# Patient Record
Sex: Female | Born: 1995 | Race: Black or African American | Hispanic: No | Marital: Single | Smoking: Never smoker
Health system: Southern US, Community
[De-identification: ages and names within clinical notes are randomized; demographics above are authoritative.]

## PROBLEM LIST (undated history)

## (undated) ENCOUNTER — Inpatient Hospital Stay (HOSPITAL_COMMUNITY): Payer: Self-pay

## (undated) DIAGNOSIS — F419 Anxiety disorder, unspecified: Secondary | ICD-10-CM

## (undated) DIAGNOSIS — F32A Depression, unspecified: Secondary | ICD-10-CM

## (undated) DIAGNOSIS — F909 Attention-deficit hyperactivity disorder, unspecified type: Secondary | ICD-10-CM

## (undated) DIAGNOSIS — F329 Major depressive disorder, single episode, unspecified: Secondary | ICD-10-CM

---

## 2016-01-18 ENCOUNTER — Emergency Department (HOSPITAL_COMMUNITY)
Admission: EM | Admit: 2016-01-18 | Discharge: 2016-01-18 | Disposition: A | Discharge status: Home / Self Care | Payer: BLUE CROSS/BLUE SHIELD | Attending: Physician Assistant | Admitting: Physician Assistant

## 2016-01-18 ENCOUNTER — Encounter (HOSPITAL_COMMUNITY): Payer: Self-pay

## 2016-01-18 DIAGNOSIS — N6001 Solitary cyst of right breast: Secondary | ICD-10-CM | POA: Diagnosis present

## 2016-01-18 DIAGNOSIS — L989 Disorder of the skin and subcutaneous tissue, unspecified: Secondary | ICD-10-CM | POA: Insufficient documentation

## 2016-01-18 DIAGNOSIS — L988 Other specified disorders of the skin and subcutaneous tissue: Secondary | ICD-10-CM

## 2016-01-18 DIAGNOSIS — N649 Disorder of breast, unspecified: Secondary | ICD-10-CM

## 2016-01-18 MED ORDER — DAPSONE 5 % EX GEL: CUTANEOUS | Status: DC

## 2016-01-18 NOTE — Discharge Instructions (Signed)
Fibrocystic Breast Changes Fibrocystic breast changes happen when tiny sacs filled with fluid form in the breast. They are not cancer. They can feel like lumps. HOME CARE  Check your breasts after every menstrual period or the first day of every month if you do not have menstrual periods. Check for:  Soreness.  New puffiness (swelling).  A change in breast size.  A change in a lump that was already there.  Only take medicine as told by your doctor.  Wear a support or sports bra that fits well.  Avoid caffeine in pop, chocolate, coffee, and tea. GET HELP IF:   You have fluid coming from your nipples, especially if it is bloody.  You have new lumps or bumps in your breast.  Your breast becomes puffy, red, and painful.  You have changes in how your breast looks.  Your nipples look flat or sunk in.   This information is not intended to replace advice given to you by your health care provider. Make sure you discuss any questions you have with your health care provider.   Document Released: 09/23/2008 Document Revised: 10/16/2013 Document Reviewed: 04/01/2013 Elsevier Interactive Patient Education 2016 Elsevier Inc[employer]   Abscess An abscess (boil or furuncle) is an infected area on or under the skin. This area is filled with yellowish-white fluid (pus) and other material (debris). HOME CARE   Only take medicines as told by your doctor.  If you were given antibiotic medicine, take it as directed. Finish the medicine even if you start to feel better.  If gauze is used, follow your doctor's directions for changing the gauze.  To avoid spreading the infection:  Keep your abscess covered with a bandage.  Wash your hands well.  Do not share personal care items, towels, or whirlpools with others.  Avoid skin contact with others.  Keep your skin and clothes clean around the abscess.  Keep all doctor visits as told. GET HELP RIGHT AWAY IF:   You have more pain, puffiness  (swelling), or redness in the wound site.  You have more fluid or blood coming from the wound site.  You have muscle aches, chills, or you feel sick.  You have a fever. MAKE SURE YOU:   Understand these instructions.  Will watch your condition.  Will get help right away if you are not doing well or get worse.   This information is not intended to replace advice given to you by your health care provider. Make sure you discuss any questions you have with your health care provider.   Document Released: 03/29/2008 Document Revised: 04/11/2012 Document Reviewed: 12/25/2011 Elsevier Interactive Patient Education [employer] Elsevier Inc.

## 2016-01-18 NOTE — ED Notes (Signed)
Onset 2 days lump on right breast near nipple.  No nipple discharge.

## 2016-01-18 NOTE — ED Provider Notes (Signed)
CSN: [id number]     Arrival date & time 01/18/16  2137 History  By signing my name below, I, Evon Slack, attest that this documentation has been prepared under the direction and in the presence of Danelle Berry, PA-C. Electronically Signed: Evon Slack, ED Scribe. 01/18/2016. 11:31 PM.    Chief Complaint  Patient presents with  . Breast Mass   The history is provided by the patient. No language interpreter was used.   HPI Comments: Cynthia Hoffman is a 20 y.o. female who presents to the Emergency Department complaining of right breast mass onset 3 days prior. Pt reports associated redness, she denies tenderness, drainage, swelling.  She looked down in the shower and noticed discoloration. Pt has not tried any treatment  PTA. Denies fever, nausea, vomiting, swollen lymph nodes, tender breasts, or hx of breast changes with periods. Pt reports Hx of hidradenitis. She states that her PCP prescribes oral dapsone to help prevent infections, however she has run out, and PCP states she needs labs before having meds again.  No family hx of breast cancer. LMP 12/30/14  History reviewed. No pertinent past medical history. History reviewed. No pertinent past surgical history. History reviewed. No pertinent family history. Social History  Substance Use Topics  . Smoking status: Never Smoker   . Smokeless tobacco: None  . Alcohol Use: No   OB History    No data available     Review of Systems  Constitutional: Negative.  Negative for fever.  HENT: Negative.   Respiratory: Negative.   Cardiovascular: Negative.   Gastrointestinal: Negative.  Negative for nausea and vomiting.  Genitourinary: Negative for menstrual problem.  Skin: Positive for color change. Negative for pallor, rash and wound.  Hematological: Negative for adenopathy.  All other systems reviewed and are negative.     Allergies  Review of patient's allergies indicates no known allergies.  Home Medications   Prior to  Admission medications   Medication Sig Start Date End Date Taking? Authorizing Provider  Dapsone 5 % topical gel Apply pea-sized amount in a thin layer to affected areas twice daily 01/18/16   Massimiliano Rohleder, PA-C   BP 142/94 mmHg  Pulse 96  Temp(Src) 98.1 F (36.7 C) (Oral)  Resp 16  Ht  (1.6 m)  Wt 90.402 kg  BMI 35.31 kg/m2  SpO2 98%  LMP 12/28/2015   Physical Exam  Constitutional: She is oriented to person, place, and time. She appears well-developed and well-nourished. No distress.  HENT:  Head: Normocephalic and atraumatic.  Right Ear: External ear normal.  Left Ear: External ear normal.  Nose: Nose normal.  Mouth/Throat: Oropharynx is clear and moist. No oropharyngeal exudate.  Eyes: Conjunctivae and EOM are normal. Pupils are equal, round, and reactive to light. Right eye exhibits no discharge. Left eye exhibits no discharge. No scleral icterus.  Neck: Normal range of motion. Neck supple. No JVD present. No tracheal deviation present.  Cardiovascular: Normal rate and regular rhythm.   Pulmonary/Chest: Effort normal and breath sounds normal. No stridor. No respiratory distress. She exhibits no mass and no tenderness. Right breast exhibits skin change. Right breast exhibits no inverted nipple, no mass, no nipple discharge and no tenderness. Breasts are symmetrical.    Genitourinary: No breast swelling, tenderness, discharge or bleeding.  Musculoskeletal: Normal range of motion. She exhibits no edema.  Lymphadenopathy:    She has no cervical adenopathy.    She has no axillary adenopathy.       Right axillary: No pectoral  and no lateral adenopathy present.  Neurological: She is alert and oriented to person, place, and time. She exhibits normal muscle tone. Coordination normal.  Skin: Skin is warm and dry. No rash noted. She is not diaphoretic. No erythema. No pallor.  Psychiatric: She has a normal mood and affect. Her behavior is normal. Judgment and thought content normal.   Nursing note and vitals reviewed.   ED Course  Procedures (including critical care time) DIAGNOSTIC STUDIES: Oxygen Saturation is 98% on RA, normal by my interpretation.    COORDINATION OF CARE: 11:32 PM-Discussed treatment plan with pt at bedside and pt agreed to plan.     Labs Review Labs Reviewed - No data to display  Imaging Review No results found.    EKG Interpretation None      MDM   Pt with reported hx of hidradenitis presents to the ER with CC of lump on breast that is hyperpigmented, non-tender, without redness, swelling or discharge.  She is a [employer] student, out of dapsone - her regular med to tx hidradenitis, states she wants something to treat skin.  Her PCP cannot refill med until she has lab work.    On exam, there is small hyperpigmented area, with some peeling skin, no swelling, erythema, tenderness, discharge.  Breast exam is normal.  Pt has no concerning lymph nodes.  Appears consistent with healing lesion.  No concern for abscess or cellulitis.  Will give topical dapsone.  Pt was encouraged to establish PCP in area or see OB/GYN for follow up.  Pt in agreement with plan.  She was discharged home in good condition, VSS.  Final diagnoses:  Lesion of skin of breast  Breast cyst, right   I personally performed the services described in this documentation, which was scribed in my presence. The recorded information has been reviewed and is accurate.        Danelle BerryLeisa Keaundre Thelin, PA-C 01/24/16 [id number]  Courteney Randall AnLyn Mackuen, MD 01/24/16 1806

## 2016-01-18 NOTE — ED Notes (Signed)
See EDP assessment 

## 2016-02-07 ENCOUNTER — Encounter (HOSPITAL_COMMUNITY): Payer: Self-pay | Admitting: Emergency Medicine

## 2016-02-07 ENCOUNTER — Emergency Department (HOSPITAL_COMMUNITY)
Admission: EM | Admit: 2016-02-07 | Discharge: 2016-02-07 | Disposition: A | Discharge status: Home / Self Care | Payer: BLUE CROSS/BLUE SHIELD | Attending: Emergency Medicine | Admitting: Emergency Medicine

## 2016-02-07 DIAGNOSIS — N611 Abscess of the breast and nipple: Secondary | ICD-10-CM | POA: Diagnosis not present

## 2016-02-07 DIAGNOSIS — Z79899 Other long term (current) drug therapy: Secondary | ICD-10-CM | POA: Insufficient documentation

## 2016-02-07 MED ORDER — DOXYCYCLINE HYCLATE 100 MG PO CAPS: 100.0000 mg | ORAL_CAPSULE | Freq: Two times a day (BID) | ORAL | Status: DC

## 2016-02-07 MED ORDER — HYDROCODONE-ACETAMINOPHEN 5-325 MG PO TABS: 1.0000 | ORAL_TABLET | Freq: Four times a day (QID) | ORAL | Status: DC | PRN

## 2016-02-07 NOTE — ED Notes (Signed)
Pt has cyst on chest that has been persistent for 3-4 weeks. Pt received topical cream for cyst. Pt states it has not gotten better.

## 2016-02-07 NOTE — ED Notes (Signed)
The pt has had an abscess on her rt breast for approx 3 months.  She was seen here 2 weeks ago for the same and given med but she does not think it is any better  She does not want it drained  She does not want a scar

## 2016-02-07 NOTE — ED Provider Notes (Signed)
CSN: [id number]     Arrival date & time 02/07/16  1505 History   First MD Initiated Contact with Patient 02/07/16 1710     Chief Complaint  Patient presents with  . Abscess     (Consider location/radiation/quality/duration/timing/severity/associated sxs/prior Treatment) HPI Comments: Patient presents to the emergency department with chief complaint of right-sided breast pain 3 months. She states that she has been being treated for breast abscess. She has a history of hidradenitis, but has never had an abscess on her breast. She denies any fevers, chills, nausea, or vomiting. She states that the symptoms have been gradually worsening over the past 2 weeks. Her symptoms are worsened with palpation. She denies any discharge. There are no other associated symptoms or modifying factors.  The history is provided by the patient. No language interpreter was used.    History reviewed. No pertinent past medical history. History reviewed. No pertinent past surgical history. History reviewed. No pertinent family history. Social History  Substance Use Topics  . Smoking status: Never Smoker   . Smokeless tobacco: None  . Alcohol Use: No   OB History    No data available     Review of Systems  Constitutional: Negative for fever and chills.  Respiratory: Negative for shortness of breath.   Cardiovascular: Negative for chest pain.  Gastrointestinal: Negative for nausea, vomiting, diarrhea and constipation.  Genitourinary: Negative for dysuria.  All other systems reviewed and are negative.     Allergies  Review of patient's allergies indicates no known allergies.  Home Medications   Prior to Admission medications   Medication Sig Start Date End Date Taking? Authorizing Provider  Dapsone 5 % topical gel Apply pea-sized amount in a thin layer to affected areas twice daily 01/18/16   Leisa Tapia, PA-C   BP 144/100 mmHg  Pulse 85  Temp(Src) 98.1 F (36.7 C) (Oral)  Resp 18  Wt 93.639  kg  SpO2 100%  LMP 01/27/2016 Physical Exam  Constitutional: She is oriented to person, place, and time. She appears well-developed and well-nourished.  HENT:  Head: Normocephalic and atraumatic.  Eyes: Conjunctivae and EOM are normal. Pupils are equal, round, and reactive to light.  Neck: Normal range of motion. Neck supple.  Cardiovascular: Normal rate and regular rhythm.  Exam reveals no gallop and no friction rub.   No murmur heard. Pulmonary/Chest: Effort normal and breath sounds normal. No respiratory distress. She has no wheezes. She has no rales. She exhibits no tenderness.  Chaperone present during breast exam 2 x 1 cm possible abscess to the 12:00 region of the right breast immediately superior to the areola with margins extending to the areola  Abdominal: Soft. Bowel sounds are normal. She exhibits no distension and no mass. There is no tenderness. There is no rebound and no guarding.  Musculoskeletal: Normal range of motion. She exhibits no edema or tenderness.  Neurological: She is alert and oriented to person, place, and time.  Skin: Skin is warm and dry.  Psychiatric: She has a normal mood and affect. Her behavior is normal. Judgment and thought content normal.  Nursing note and vitals reviewed.   ED Course  Procedures (including critical care time) EMERGENCY DEPARTMENT US SOFT TISSUE INTERPRETATION "Study: Limited Ultrasound of the noted body part in comments below"  INDICATIONS: Pain Multiple views of the body part are obtained with a multi-frequency linear probe  PERFORMED BY:  Myself  IMAGES ARCHIVED?: Yes  SIDE:Right   BODY PART:Breast  FINDINGS: Abcess present  LIMITATIONS:  Body Habitus  INTERPRETATION:  Abcess present  COMMENT:      MDM   Final diagnoses:  Breast abscess    Patient with possible breast abscess. She states her symptoms have been gradually worsening over the past 2 weeks. She has tried dapsone with no improvement. Patient  discussed with Dr. Effie Shy, who recommends outpatient surgery follow-up given that the symptoms have been slowly worsening over 2 weeks, the patient is afebrile, is nontoxic-appearing, and is not in any distress. I will add doxycycline and give the patient something for pain. Recommend general surgery follow-up in 2 days.    Roxy Horseman, PA-C 02/07/16 1748  Mancel Bale, MD 02/08/16 0005

## 2016-02-07 NOTE — Discharge Instructions (Signed)

## 2016-02-10 ENCOUNTER — Emergency Department (HOSPITAL_COMMUNITY)
Admission: EM | Admit: 2016-02-10 | Discharge: 2016-02-11 | Disposition: A | Discharge status: Home / Self Care | Payer: BLUE CROSS/BLUE SHIELD | Attending: Emergency Medicine | Admitting: Emergency Medicine

## 2016-02-10 ENCOUNTER — Encounter (HOSPITAL_COMMUNITY): Payer: Self-pay

## 2016-02-10 DIAGNOSIS — H578 Other specified disorders of eye and adnexa: Secondary | ICD-10-CM | POA: Diagnosis present

## 2016-02-10 DIAGNOSIS — R51 Headache: Secondary | ICD-10-CM | POA: Insufficient documentation

## 2016-02-10 NOTE — ED Notes (Signed)
Called for VS, no answer

## 2016-02-10 NOTE — ED Notes (Signed)
Pt started on Bactrim yesterday for cyst on breast.  Upon awakening this morning pts eyes were reddened and swollen and has headache  Pt took dose of Bactrim today but thinks she is having allergic reaction to the med,  No lip, tongue or throat swelling.  NAD at triage.

## 2016-03-20 ENCOUNTER — Emergency Department (HOSPITAL_COMMUNITY)
Admission: EM | Admit: 2016-03-20 | Discharge: 2016-03-20 | Disposition: A | Discharge status: Home / Self Care | Payer: BLUE CROSS/BLUE SHIELD | Attending: Emergency Medicine | Admitting: Emergency Medicine

## 2016-03-20 ENCOUNTER — Encounter (HOSPITAL_COMMUNITY): Payer: Self-pay | Admitting: Emergency Medicine

## 2016-03-20 DIAGNOSIS — Z792 Long term (current) use of antibiotics: Secondary | ICD-10-CM | POA: Diagnosis not present

## 2016-03-20 DIAGNOSIS — L02412 Cutaneous abscess of left axilla: Secondary | ICD-10-CM | POA: Insufficient documentation

## 2016-03-20 DIAGNOSIS — L02419 Cutaneous abscess of limb, unspecified: Secondary | ICD-10-CM

## 2016-03-20 DIAGNOSIS — M79622 Pain in left upper arm: Secondary | ICD-10-CM | POA: Diagnosis present

## 2016-03-20 MED ORDER — LIDOCAINE HCL (PF) 1 % IJ SOLN
5.0000 mL | Freq: Once | INTRAMUSCULAR | Status: AC
  Administered 2016-03-20: 5 mL
  Filled 2016-03-20: qty 5

## 2016-03-20 MED ORDER — OXYCODONE-ACETAMINOPHEN 5-325 MG PO TABS: 2.0000 | ORAL_TABLET | ORAL | Status: DC | PRN

## 2016-03-20 NOTE — ED Provider Notes (Signed)
CSN: [id number]     Arrival date & time 03/20/16  1520 History  By signing my name below, I, Emmanuella Mensah, attest that this documentation has been prepared under the direction and in the presence of Danelle Berry, PA-C.  Electronically Signed: Angelene Giovanni, ED Scribe. 03/20/2016. 4:38 PM.    Chief Complaint  Patient presents with  . Cyst    The patient has the "hydronititis" where she gets boils.  The patient is on Dapsone, Clindamycin, and Bactrim for this and oral ointment but she still has them.  This boil started three days ago.    The history is provided by the patient. No language interpreter was used.   HPI Comments: Cynthia Hoffman is a 20 y.o. female with a hx of hydradenitis who presents to the Emergency Department complaining of gradually worsening painful area of redness and swelling to her right axilla onset 3 days ago. No drainage. She explains that she has recurrent abscesses to the axilla and her right breast and she has seen a surgeon for the recurrent abscesses she had on her right breast but not the axilla. No alleviating factors noted. Pt has not tried to drain area. She states that she is currently on Bactrim, Clindamycin, and Dapsone s/p I&D of the abscess on her right breast by a Surgeon. She denies any fevers or n/v. No other complaints at this time.    History reviewed. No pertinent past medical history. History reviewed. No pertinent past surgical history. History reviewed. No pertinent family history. Social History  Substance Use Topics  . Smoking status: Never Smoker   . Smokeless tobacco: None  . Alcohol Use: No   OB History    No data available     Review of Systems  Constitutional: Negative for fever.  Gastrointestinal: Negative for nausea and vomiting.  Skin: Positive for color change.       Painful area of swelling and redness  All other systems reviewed and are negative.     Allergies  Review of patient's allergies indicates no known  allergies.  Home Medications   Prior to Admission medications   Medication Sig Start Date End Date Taking? Authorizing Provider  Dapsone 5 % topical gel Apply pea-sized amount in a thin layer to affected areas twice daily 01/18/16   Danelle Berry, PA-C  doxycycline (VIBRAMYCIN) 100 MG capsule Take 1 capsule (100 mg total) by mouth 2 (two) times daily. 02/07/16   Roxy Horseman, PA-C  HYDROcodone-acetaminophen (NORCO/VICODIN) 5-325 MG tablet Take 1-2 tablets by mouth every 6 (six) hours as needed. 02/07/16   Roxy Horseman, PA-C   BP 129/72 mmHg  Pulse 96  Temp(Src) 99.4 F (37.4 C) (Oral)  Resp 14  SpO2 100%  LMP 02/24/2016 Physical Exam  Constitutional: She is oriented to person, place, and time. She appears well-developed and well-nourished. No distress.  HENT:  Head: Normocephalic and atraumatic.  Right Ear: External ear normal.  Left Ear: External ear normal.  Nose: Nose normal.  Eyes: Conjunctivae and EOM are normal. Pupils are equal, round, and reactive to light. Right eye exhibits no discharge. Left eye exhibits no discharge. No scleral icterus.  Neck: Normal range of motion. Neck supple. No tracheal deviation present.  Cardiovascular: Normal rate and regular rhythm.   Pulmonary/Chest: Effort normal and breath sounds normal. No stridor. No respiratory distress.  Musculoskeletal: Normal range of motion. She exhibits no edema.  Lymphadenopathy:    She has no cervical adenopathy.  Neurological: She is alert and oriented to  person, place, and time. She exhibits normal muscle tone. Coordination normal.  Skin: Skin is warm and dry. No rash noted. She is not diaphoretic. There is erythema. No pallor.  Left axilla - 6 by 2 cm area edematous and erythematous, ttp with fluctuance, no drainage. Multiple scars in remaining axillary skin  Psychiatric: She has a normal mood and affect. Her behavior is normal. Judgment and thought content normal.  Nursing note and vitals reviewed.   ED  Course  Procedures (including critical care time) DIAGNOSTIC STUDIES: Oxygen Saturation is 100% on RA, normal by my interpretation.    COORDINATION OF CARE: 4:19 PM- Pt advised of plan for treatment and pt agrees. Pt will receive an I&D with lidocaine.   INCISION AND DRAINAGE Performed by: Danelle BerryLeisa Markelle Asaro   Consent: Verbal consent obtained. Risks and benefits: risks, benefits and alternatives were discussed Time out performed prior to procedure Type: abscess Body area: left axilla Anesthesia: local infiltration Incision was made with a scalpel. Local anesthetic: lidocaine 1 % with epinephrine Anesthetic total: 1.5 ml Complexity: complex Blunt dissection to break up loculations Drainage: purulent white to yellow foul smelling Drainage amount: copious Packing material: none Patient tolerance: Patient tolerated the procedure well with no immediate complications.     MDM   Patient with abscess, located in her left axilla, hx of hidradenitis,  amenable to incision and drainage.  Abscess was not large enough to warrant packing or drain,  wound recheck in 2 days. Encouraged home warm soaks and flushing.   Mild signs of cellulitis is surrounding skin.  Will d/c to home.  Pt reports currently taking clindamycin, doxy and using dapsone gel, under the care of a surgeon.    Final diagnoses:  Axillary abscess     I personally performed the services described in this documentation, which was scribed in my presence. The recorded information has been reviewed and is accurate.     Danelle BerryLeisa Jeraldin Fesler, PA-C 03/20/16 1646  Laurence Spatesachel Morgan Little, MD 03/22/16 [phone]

## 2016-03-20 NOTE — ED Notes (Signed)
Declined W/C at D/C and was escorted to lobby by RN. 

## 2016-03-20 NOTE — ED Notes (Signed)
C/o abcess under right arm  Onset 3 days ago

## 2016-03-20 NOTE — Discharge Instructions (Signed)
Abscess °An abscess is an infected area that contains a collection of pus and debris. It can occur in almost any part of the body. An abscess is also known as a furuncle or boil. °CAUSES  °An abscess occurs when tissue gets infected. This can occur from blockage of oil or sweat glands, infection of hair follicles, or a minor injury to the skin. As the body tries to fight the infection, pus collects in the area and creates pressure under the skin. This pressure causes pain. People with weakened immune systems have difficulty fighting infections and get certain abscesses more often.  °SYMPTOMS °Usually an abscess develops on the skin and becomes a painful mass that is red, warm, and tender. If the abscess forms under the skin, you may feel a moveable soft area under the skin. Some abscesses break open (rupture) on their own, but most will continue to get worse without care. The infection can spread deeper into the body and eventually into the bloodstream, causing you to feel ill.  °DIAGNOSIS  °Your caregiver will take your medical history and perform a physical exam. A sample of fluid may also be taken from the abscess to determine what is causing your infection. °TREATMENT  °Your caregiver may prescribe antibiotic medicines to fight the infection. However, taking antibiotics alone usually does not cure an abscess. Your caregiver may need to make a small cut (incision) in the abscess to drain the pus. In some cases, gauze is packed into the abscess to reduce pain and to continue draining the area. °HOME CARE INSTRUCTIONS  °· Only take over-the-counter or prescription medicines for pain, discomfort, or fever as directed by your caregiver. °· If you were prescribed antibiotics, take them as directed. Finish them even if you start to feel better. °· If gauze is used, follow your caregiver's directions for changing the gauze. °· To avoid spreading the infection: °· Keep your draining abscess covered with a  bandage. °· Wash your hands well. °· Do not share personal care items, towels, or whirlpools with others. °· Avoid skin contact with others. °· Keep your skin and clothes clean around the abscess. °· Keep all follow-up appointments as directed by your caregiver. °SEEK MEDICAL CARE IF:  °· You have increased pain, swelling, redness, fluid drainage, or bleeding. °· You have muscle aches, chills, or a general ill feeling. °· You have a fever. °MAKE SURE YOU:  °· Understand these instructions. °· Will watch your condition. °· Will get help right away if you are not doing well or get worse. °  °This information is not intended to replace advice given to you by your health care provider. Make sure you discuss any questions you have with your health care provider. °  °Document Released: 07/21/2005 Document Revised: 04/11/2012 Document Reviewed: 12/24/2011 °Elsevier Interactive Patient Education ©2016 Elsevier Inc[employer] ° °Incision and Drainage °Incision and drainage is a procedure in which a sac-like structure (cystic structure) is opened and drained. The area to be drained usually contains material such as pus, fluid, or blood.  °LET YOUR CAREGIVER KNOW ABOUT:  °· Allergies to medicine. °· Medicines taken, including vitamins, herbs, eyedrops, over-the-counter medicines, and creams. °· Use of steroids (by mouth or creams). °· Previous problems with anesthetics or numbing medicines. °· History of bleeding problems or blood clots. °· Previous surgery. °· Other health problems, including diabetes and kidney problems. °· Possibility of pregnancy, if this applies. °RISKS AND COMPLICATIONS °· Pain. °· Bleeding. °· Scarring. °· Infection. °BEFORE THE PROCEDURE  °  You may need to have an ultrasound or other imaging tests to see how large or deep your cystic structure is. Blood tests may also be used to determine if you have an infection or how severe the infection is. You may need to have a tetanus shot. PROCEDURE  The affected area  is cleaned with a cleaning fluid. The cyst area will then be numbed with a medicine (local anesthetic). A small incision will be made in the cystic structure. A syringe or catheter may be used to drain the contents of the cystic structure, or the contents may be squeezed out. The area will then be flushed with a cleansing solution. After cleansing the area, it is often gently packed with a gauze or another wound dressing. Once it is packed, it will be covered with gauze and tape or some other type of wound dressing. AFTER THE PROCEDURE   Often, you will be allowed to go home right after the procedure.  You may be given antibiotic medicine to prevent or heal an infection.  If the area was packed with gauze or some other wound dressing, you will likely need to come back in 1 to 2 days to get it removed.  The area should heal in about 14 days.   This information is not intended to replace advice given to you by your health care provider. Make sure you discuss any questions you have with your health care provider.   Document Released: 04/06/2001 Document Revised: 04/11/2012 Document Reviewed: 12/06/2011 Elsevier Interactive Patient Education 2016 [employer] Inc[employer] Incision and Drainage, Care After Refer to this sheet in the next few weeks. These instructions provide you with information on caring for yourself after your procedure. Your caregiver may also give you more specific instructions. Your treatment has been planned according to current medical practices, but problems sometimes occur. Call your caregiver if you have any problems or questions after your procedure. HOME CARE INSTRUCTIONS   If antibiotic medicine is given, take it as directed. Finish it even if you start to feel better.  Only take over-the-counter or prescription medicines for pain, discomfort, or fever as directed by your caregiver.  Keep all follow-up appointments as directed by your caregiver.  Change any bandages  (dressings) as directed by your caregiver. Replace old dressings with clean dressings.  Wash your hands before and after caring for your wound. You will receive specific instructions for cleansing and caring for your wound.  SEEK MEDICAL CARE IF:   You have increased pain, swelling, or redness around the wound.  You have increased drainage, smell, or bleeding from the wound.  You have muscle aches, chills, or you feel generally sick.  You have a fever. MAKE SURE YOU:   Understand these instructions.  Will watch your condition.  Will get help right away if you are not doing well or get worse.   This information is not intended to replace advice given to you by your health care provider. Make sure you discuss any questions you have with your health care provider.   Document Released: 01/03/2012 Document Revised: 11/01/2014 Document Reviewed: 01/03/2012 Elsevier Interactive Patient Education 2016 Else[employer] Inc[employer] Hidradenitis Suppurativa Hidradenitis suppurativa is a long-term (chronic) skin disease that starts with blocked sweat glands or hair follicles. Bacteria may grow in these blocked openings of your skin. Hidradenitis suppurativa is like a severe form of acne that develops in areas of your body where acne would be unusual. It is most likely to affect the areas of your  body where skin rubs against skin and becomes moist. This includes your:  Underarms.  Groin.  Genital areas.  Buttocks.  Upper thighs.  Breasts. Hidradenitis suppurativa may start out with small pimples. The pimples can develop into deep sores that break open (rupture) and drain pus. Over time your skin may thicken and become scarred. Hidradenitis suppurativa cannot be passed from person to person.  CAUSES  The exact cause of hidradenitis suppurativa is not known. This condition may be due to:  Female and female hormones. The condition is rare before and after puberty.  An overactive body defense system  (immune system). Your immune system may overreact to the blocked hair follicles or sweat glands and cause swelling and pus-filled sores. RISK FACTORS You may have a higher risk of hidradenitis suppurativa if you:  Are a woman.  Are between ages 4511 and 6155.  Have a family history of hidradenitis suppurativa.  Have a personal history of acne.  Are overweight.  Smoke.  Take the drug lithium. SIGNS AND SYMPTOMS  The first signs of an outbreak are usually painful skin bumps that look like pimples. As the condition progresses:  Skin bumps may get bigger and grow deeper into the skin.  Bumps under the skin may rupture and drain smelly pus.  Skin may become itchy and infected.  Skin may thicken and scar.  Drainage may continue through tunnels under the skin (fistulas).  Walking and moving your arms can become painful. DIAGNOSIS  Your health care provider may diagnose hidradenitis suppurativa based on your medical history and your signs and symptoms. A physical exam will also be done. You may need to see a health care provider who specializes in skin diseases (dermatologist). You may also have tests done to confirm the diagnosis. These can include:  Swabbing a sample of pus or drainage from your skin so it can be sent to the lab and tested for infection.  Blood tests to check for infection. TREATMENT  The same treatment will not work for everybody with hidradenitis suppurativa. Your treatment will depend on how severe your symptoms are. You may need to try several treatments to find what works best for you. Part of your treatment may include cleaning and bandaging (dressing) your wounds. You may also have to take medicines, such as the following:  Antibiotics.  Acne medicines.  Medicines to block or suppress the immune system.  A diabetes medicine (metformin) is sometimes used to treat this condition.  For women, birth control pills can sometimes help relieve symptoms. You may  need surgery if you have a severe case of hidradenitis suppurativa that does not respond to medicine. Surgery may involve:   Using a laser to clear the skin and remove hair follicles.  Opening and draining deep sores.  Removing the areas of skin that are diseased and scarred. HOME CARE INSTRUCTIONS  Learn as much as you can about your disease, and work closely with your health care providers.  Take medicines only as directed by your health care provider.  If you were prescribed an antibiotic medicine, finish it all even if you start to feel better.  If you are overweight, losing weight may be very helpful. Try to reach and maintain a healthy weight.  Do not use any tobacco products, including cigarettes, chewing tobacco, or electronic cigarettes. If you need help quitting, ask your health care provider.  Do not shave the areas where you get hidradenitis suppurativa.  Do not wear deodorant.  Wear loose-fitting clothes.  Try not to overheat and get sweaty.  Take a daily bleach bath as directed by your health care provider.  Fill your bathtub halfway with water.  Pour in  cup of unscented household bleach.  Soak for 5-10 minutes.  Cover sore areas with a warm, clean washcloth (compress) for 5-10 minutes. SEEK MEDICAL CARE IF:   You have a flare-up of hidradenitis suppurativa.  You have chills or a fever.  You are having trouble controlling your symptoms at home.   This information is not intended to replace advice given to you by your health care provider. Make sure you discuss any questions you have with your health care provider.   Document Released: 05/25/2004 Document Revised: 11/01/2014 Document Reviewed: 01/11/2014 Elsevier Interactive Patient Education [employer]

## 2016-03-20 NOTE — ED Notes (Signed)
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  The patient is on Dapsone, Clindamycin, and Bactrim for this and oral ointment but she still has them.  This boil under your right arm started three days ago. The patients rates her pain 10/10.

## 2016-07-24 ENCOUNTER — Inpatient Hospital Stay (HOSPITAL_COMMUNITY)
Admission: AD | Admit: 2016-07-24 | Discharge: 2016-07-24 | Disposition: A | Discharge status: Home / Self Care | Payer: Managed Care, Other (non HMO) | Source: Ambulatory Visit | Attending: Obstetrics & Gynecology | Admitting: Obstetrics & Gynecology

## 2016-07-24 ENCOUNTER — Inpatient Hospital Stay (HOSPITAL_COMMUNITY): Payer: Managed Care, Other (non HMO)

## 2016-07-24 ENCOUNTER — Encounter (HOSPITAL_COMMUNITY): Payer: Self-pay

## 2016-07-24 DIAGNOSIS — O98311 Other infections with a predominantly sexual mode of transmission complicating pregnancy, first trimester: Secondary | ICD-10-CM | POA: Insufficient documentation

## 2016-07-24 DIAGNOSIS — Z3A01 Less than 8 weeks gestation of pregnancy: Secondary | ICD-10-CM | POA: Diagnosis not present

## 2016-07-24 DIAGNOSIS — A5901 Trichomonal vulvovaginitis: Secondary | ICD-10-CM | POA: Insufficient documentation

## 2016-07-24 DIAGNOSIS — A599 Trichomoniasis, unspecified: Secondary | ICD-10-CM

## 2016-07-24 DIAGNOSIS — R102 Pelvic and perineal pain: Secondary | ICD-10-CM

## 2016-07-24 DIAGNOSIS — O98911 Unspecified maternal infectious and parasitic disease complicating pregnancy, first trimester: Secondary | ICD-10-CM

## 2016-07-24 DIAGNOSIS — O26851 Spotting complicating pregnancy, first trimester: Secondary | ICD-10-CM | POA: Diagnosis present

## 2016-07-24 LAB — URINALYSIS, ROUTINE W REFLEX MICROSCOPIC
Bilirubin Urine: NEGATIVE
Glucose, UA: NEGATIVE mg/dL
KETONES UR: NEGATIVE mg/dL
LEUKOCYTES UA: NEGATIVE
NITRITE: NEGATIVE
PH: 6.5 (ref 5.0–8.0)
PROTEIN: NEGATIVE mg/dL
Specific Gravity, Urine: >1.03 — ABNORMAL HIGH (ref 1.005–1.030)

## 2016-07-24 LAB — WET PREP, GENITAL
SPERM: NONE SEEN
YEAST WET PREP: NONE SEEN

## 2016-07-24 LAB — URINE MICROSCOPIC-ADD ON

## 2016-07-24 LAB — ABO/RH: ABO/RH(D): B POS

## 2016-07-24 LAB — POCT PREGNANCY, URINE: Preg Test, Ur: POSITIVE — AB

## 2016-07-24 LAB — HCG, QUANTITATIVE, PREGNANCY: HCG, BETA CHAIN, QUANT, S: 102076 m[IU]/mL — AB (ref <5)

## 2016-07-24 MED ORDER — METRONIDAZOLE 500 MG PO TABS: 500.0000 mg | ORAL_TABLET | Freq: Two times a day (BID) | ORAL | 0 refills | Status: DC

## 2016-07-24 NOTE — Discharge Instructions (Signed)
Trichomoniasis Trichomoniasis is an infection caused by an organism called Trichomonas. The infection can affect both women and men. In women, the outer female genitalia and the vagina are affected. In men, the penis is mainly affected, but the prostate and other reproductive organs can also be involved. Trichomoniasis is a sexually transmitted infection (STI) and is most often passed to another person through sexual contact.  RISK FACTORS  Having unprotected sexual intercourse.  Having sexual intercourse with an infected partner. SIGNS AND SYMPTOMS  Symptoms of trichomoniasis in women include:  Abnormal gray-green frothy vaginal discharge.  Itching and irritation of the vagina.  Itching and irritation of the area outside the vagina. Symptoms of trichomoniasis in men include:   Penile discharge with or without pain.  Pain during urination. This results from inflammation of the urethra. DIAGNOSIS  Trichomoniasis may be found during a Pap test or physical exam. Your health care provider may use one of the following methods to help diagnose this infection:  Testing the pH of the vagina with a test tape.  Using a vaginal swab test that checks for the Trichomonas organism. A test is available that provides results within a few minutes.  Examining a urine sample.  Testing vaginal secretions. Your health care provider may test you for other STIs, including HIV. TREATMENT   You may be given medicine to fight the infection. Women should inform their health care provider if they could be or are pregnant. Some medicines used to treat the infection should not be taken during pregnancy.  Your health care provider may recommend over-the-counter medicines or creams to decrease itching or irritation.  Your sexual partner will need to be treated if infected.  Your health care provider may test you for infection again 3 months after treatment. HOME CARE INSTRUCTIONS   Take medicines only as  directed by your health care provider.  Take over-the-counter medicine for itching or irritation as directed by your health care provider.  Do not have sexual intercourse while you have the infection.  Women should not douche or wear tampons while they have the infection.  Discuss your infection with your partner. Your partner may have gotten the infection from you, or you may have gotten it from your partner.  Have your sex partner get examined and treated if necessary.  Practice safe, informed, and protected sex.  See your health care provider for other STI testing. SEEK MEDICAL CARE IF:   You still have symptoms after you finish your medicine.  You develop abdominal pain.  You have pain when you urinate.  You have bleeding after sexual intercourse.  You develop a rash.  Your medicine makes you sick or makes you throw up (vomit). MAKE SURE YOU:  Understand these instructions.  Will watch your condition.  Will get help right away if you are not doing well or get worse.   This information is not intended to replace advice given to you by your health care provider. Make sure you discuss any questions you have with your health care provider.   Document Released: 04/06/2001 Document Revised: 11/01/2014 Document Reviewed: 07/23/2013 Elsevier Interactive Patient Education 2016 Elsevier Inc.  

## 2016-07-24 NOTE — MAU Note (Signed)
Pt states that she had +upt 2-3 weeks ago. Pt started having some lower abdominal cramping, back pain and vaginal spotting that started this week, but cramping got worse today. Rates 3/10. LMP: unknown

## 2016-07-24 NOTE — MAU Provider Note (Signed)
History   G1 in with pos preg test with c/o abd cramping and spoting.she states this has been going on for several days days now.  Pt states she is [employer] and was on ocp's and has not missed any pills so she does not know how far along she is. She also states she is not planning on continuing pregnancy.  CSN: [id number]  Arrival date & time 07/24/16  1756   None     Chief Complaint  Patient presents with  . Possible Pregnancy  . Back Pain  . Vaginal Bleeding    HPI  History reviewed. No pertinent past medical history.  History reviewed. No pertinent surgical history.  No family history on file.  Social History  Substance Use Topics  . Smoking status: Never Smoker  . Smokeless tobacco: Never Used  . Alcohol use No    OB History    Gravida Para Term Preterm AB Living   1             SAB TAB Ectopic Multiple Live Births                  Review of Systems  Constitutional: Negative.   HENT: Negative.   Eyes: Negative.   Respiratory: Negative.   Cardiovascular: Negative.   Gastrointestinal: Positive for abdominal pain.  Endocrine: Negative.   Genitourinary: Positive for vaginal bleeding.  Musculoskeletal: Negative.   Skin: Negative.   Allergic/Immunologic: Negative.   Neurological: Negative.   Hematological: Negative.   Psychiatric/Behavioral: Negative.     Allergies  Review of patient's allergies indicates no known allergies.  Home Medications    BP 140/81 (BP Location: Right Arm)   Pulse 88   Temp 98.1 F (36.7 C) (Oral)   Resp 17   Ht 5\' 3"  (1.6 m)   Wt 203 lb (92.1 kg)   LMP  (LMP Unknown)   SpO2 100%   BMI 35.96 kg/m   Physical Exam  Constitutional: She is oriented to person, place, and time. She appears well-developed and well-nourished.  HENT:  Head: Normocephalic.  Eyes: Pupils are equal, round, and reactive to light.  Neck: Normal range of motion.  Cardiovascular: Normal rate, regular rhythm, normal heart sounds and intact  distal pulses.   Pulmonary/Chest: Effort normal and breath sounds normal.  Abdominal: Soft. Bowel sounds are normal.  Genitourinary: Uterus normal.  Genitourinary Comments: Spotting amt brownish vag bleeding  Musculoskeletal: Normal range of motion.  Neurological: She is alert and oriented to person, place, and time. She has normal reflexes.  Skin: Skin is warm and dry.  Psychiatric: She has a normal mood and affect. Her behavior is normal. Judgment and thought content normal.    MAU Course  Procedures (including critical care time)  Labs Reviewed  URINALYSIS, ROUTINE W REFLEX MICROSCOPIC (NOT AT [facility]) - Abnormal; Notable for the following:       Result Value   APPearance HAZY (*)    Specific Gravity, Urine >1.030 (*)    Hgb urine dipstick SMALL (*)    All other components within normal limits  URINE MICROSCOPIC-ADD ON - Abnormal; Notable for the following:    Squamous Epithelial / LPF 0-5 (*)    Bacteria, UA FEW (*)    All other components within normal limits  POCT PREGNANCY, URINE - Abnormal; Notable for the following:    Preg Test, Ur POSITIVE (*)    All other components within normal limits   No results found.   No diagnosis  found. Dx: IUP@ 7.4 wks Trich   MDM  Preg with unknown location Threat ab vs ectopic preg. Plan: wet prep Trich, GC and chla, vag prob u/s to assess location, and viability of pregnancy.Discussed importance of STD prevention. Flagyl 500 BID x 7 days. D/c home

## 2016-07-26 LAB — GC/CHLAMYDIA PROBE AMP (~~LOC~~) NOT AT ARMC
CHLAMYDIA, DNA PROBE: NEGATIVE
Neisseria Gonorrhea: NEGATIVE

## 2016-08-21 HISTORY — PX: INDUCED ABORTION: SHX677

## 2016-08-26 ENCOUNTER — Emergency Department (HOSPITAL_COMMUNITY)
Admission: EM | Admit: 2016-08-26 | Discharge: 2016-08-27 | Disposition: A | Discharge status: Home / Self Care | Payer: Managed Care, Other (non HMO) | Attending: Emergency Medicine | Admitting: Emergency Medicine

## 2016-08-26 ENCOUNTER — Ambulatory Visit (HOSPITAL_COMMUNITY)
Admission: RE | Admit: 2016-08-26 | Discharge: 2016-08-26 | Disposition: A | Discharge status: Home / Self Care | Payer: BLUE CROSS/BLUE SHIELD | Attending: Psychiatry | Admitting: Psychiatry

## 2016-08-26 ENCOUNTER — Encounter (HOSPITAL_COMMUNITY): Payer: Self-pay | Admitting: *Deleted

## 2016-08-26 DIAGNOSIS — Z79899 Other long term (current) drug therapy: Secondary | ICD-10-CM | POA: Diagnosis not present

## 2016-08-26 DIAGNOSIS — Z046 Encounter for general psychiatric examination, requested by authority: Secondary | ICD-10-CM | POA: Diagnosis present

## 2016-08-26 DIAGNOSIS — N939 Abnormal uterine and vaginal bleeding, unspecified: Secondary | ICD-10-CM | POA: Diagnosis not present

## 2016-08-26 DIAGNOSIS — F332 Major depressive disorder, recurrent severe without psychotic features: Secondary | ICD-10-CM | POA: Diagnosis not present

## 2016-08-26 DIAGNOSIS — Z008 Encounter for other general examination: Secondary | ICD-10-CM

## 2016-08-26 LAB — RAPID URINE DRUG SCREEN, HOSP PERFORMED
AMPHETAMINES: NOT DETECTED
BENZODIAZEPINES: NOT DETECTED
Barbiturates: NOT DETECTED
COCAINE: NOT DETECTED
Opiates: NOT DETECTED
Tetrahydrocannabinol: POSITIVE — AB

## 2016-08-26 LAB — CBC
HCT: 36.1 % (ref 36.0–46.0)
Hemoglobin: 11.7 g/dL — ABNORMAL LOW (ref 12.0–15.0)
MCH: 26.5 pg (ref 26.0–34.0)
MCHC: 32.4 g/dL (ref 30.0–36.0)
MCV: 81.9 fL (ref 78.0–100.0)
PLATELETS: 324 10*3/uL (ref 150–400)
RBC: 4.41 MIL/uL (ref 3.87–5.11)
RDW: 13.4 % (ref 11.5–15.5)
WBC: 9.2 10*3/uL (ref 4.0–10.5)

## 2016-08-26 LAB — COMPREHENSIVE METABOLIC PANEL
ALBUMIN: 3.7 g/dL (ref 3.5–5.0)
ALT: 19 U/L (ref 14–54)
ANION GAP: 6 (ref 5–15)
AST: 17 U/L (ref 15–41)
Alkaline Phosphatase: 56 U/L (ref 38–126)
BILIRUBIN TOTAL: 0.4 mg/dL (ref 0.3–1.2)
BUN: 7 mg/dL (ref 6–20)
CHLORIDE: 107 mmol/L (ref 101–111)
CO2: 25 mmol/L (ref 22–32)
Calcium: 9 mg/dL (ref 8.9–10.3)
Creatinine, Ser: 0.71 mg/dL (ref 0.44–1.00)
GFR calc Af Amer: >60 mL/min (ref >60)
GFR calc non Af Amer: >60 mL/min (ref >60)
GLUCOSE: 92 mg/dL (ref 65–99)
POTASSIUM: 3.5 mmol/L (ref 3.5–5.1)
Sodium: 138 mmol/L (ref 135–145)
TOTAL PROTEIN: 7.3 g/dL (ref 6.5–8.1)

## 2016-08-26 LAB — SALICYLATE LEVEL: Salicylate Lvl: <7 mg/dL (ref 2.8–30.0)

## 2016-08-26 LAB — ETHANOL

## 2016-08-26 LAB — ACETAMINOPHEN LEVEL

## 2016-08-26 NOTE — ED Notes (Signed)
Unsuccessful attempts to draw labs.

## 2016-08-26 NOTE — H&P (Signed)
Behavioral Health Medical Screening Exam  Cynthia Hoffman is an 20 y.o. female who [facility]. Patient reports depression and suicidal thoughts after a recent abortion. Reports that she has been drinking wine, taking pain meds, and using marijuana so she will not have to think about it. Denies a definite plan, but states that she would probably overdose on the medications that were prescribed after the abortion. Denies homicidal ideations and audiovisual hallucinations. Does not appear to be responding to internal stimuli.  Total Time spent with patient: 15 minutes  Psychiatric Specialty Exam: Physical Exam  Constitutional: She is oriented to person, place, and time. She appears well-developed and well-nourished.  HENT:  Head: Normocephalic and atraumatic.  Right Ear: External ear normal.  Left Ear: External ear normal.  Eyes: Conjunctivae and EOM are normal. Right eye exhibits no discharge. Left eye exhibits no discharge. No scleral icterus.  Neck: Normal range of motion.  Cardiovascular: Normal rate, regular rhythm and normal heart sounds.   Respiratory: Effort normal and breath sounds normal. No respiratory distress.  Musculoskeletal: Normal range of motion.  Neurological: She is alert and oriented to person, place, and time.  Skin: Skin is warm and dry.  Psychiatric: Her speech is normal. Her mood appears anxious. Her affect is not angry, not blunt, not labile and not inappropriate. She is withdrawn. She is not actively hallucinating. Thought content is not paranoid and not delusional. Cognition and memory are normal. She exhibits a depressed mood. She expresses suicidal ideation. She expresses no homicidal ideation. She expresses no suicidal plans and no homicidal plans.    Review of Systems  Genitourinary:       Cramping that she relates to recent abortion  Psychiatric/Behavioral: Positive for depression, substance abuse and suicidal ideas. Negative for  hallucinations and memory loss. The patient is nervous/anxious and has insomnia.     Blood pressure 133/90, pulse 85, temperature 98.7 F (37.1 C), resp. rate 18, SpO2 100 %.There is no height or weight on file to calculate BMI.  General Appearance: Casual and Disheveled  Eye Contact:  Good  Speech:  Clear and Coherent  Volume:  Normal  Mood:  Anxious, Depressed and Hopeless  Affect:  Appropriate  Thought Process:  Disorganized  Orientation:  Full (Time, Place, and Person)  Thought Content:  Logical, Hallucinations: None and Rumination  Suicidal Thoughts:  Yes.  without intent/plan  Homicidal Thoughts:  No  Memory:  Immediate;   Good Recent;   Good Remote;   Good  Judgement:  Fair  Insight:  Fair  Psychomotor Activity:  Normal  Concentration: Concentration: Good and Attention Span: Good  Recall:  Good  Fund of Knowledge:Good  Language: Good  Akathisia:  NA  Handed:  Right  AIMS (if indicated):     Assets:  Communication Skills Desire for Improvement Financial Resources/Insurance Housing Social Support  Sleep:       Musculoskeletal: Strength & Muscle Tone: within normal limits Gait & Station: normal Patient leans: N/A  Blood pressure 133/90, pulse 85, temperature 98.7 F (37.1 C), resp. rate 18, SpO2 100 %.  Recommendations:  Based on my evaluation the patient appears to have an emergency medical condition for which I recommend the patient be transferred to the emergency department for further evaluation. Plan for inpatient admission after medical clearance.  Jackelyn PolingJason A Lilyanne Mcquown, NP 08/26/2016, 9:39 PM
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
## 2016-08-26 NOTE — ED Triage Notes (Signed)
[facility]. Pt reports depression and suicidal thoughts since recent abortion. Pt had emergency abortion on Saturday, states she still has abdominal cramping and vaginal bleeding.
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
## 2016-08-27 ENCOUNTER — Inpatient Hospital Stay (HOSPITAL_COMMUNITY)
Admission: EM | Admit: 2016-08-27 | Discharge: 2016-09-01 | DRG: 880 | Disposition: A | Discharge status: Home / Self Care | Payer: Managed Care, Other (non HMO) | Source: Intra-hospital | Attending: Psychiatry | Admitting: Psychiatry

## 2016-08-27 ENCOUNTER — Encounter (HOSPITAL_COMMUNITY): Payer: Self-pay | Admitting: *Deleted

## 2016-08-27 DIAGNOSIS — F1020 Alcohol dependence, uncomplicated: Secondary | ICD-10-CM | POA: Diagnosis present

## 2016-08-27 DIAGNOSIS — F332 Major depressive disorder, recurrent severe without psychotic features: Secondary | ICD-10-CM | POA: Diagnosis present

## 2016-08-27 DIAGNOSIS — Z813 Family history of other psychoactive substance abuse and dependence: Secondary | ICD-10-CM

## 2016-08-27 DIAGNOSIS — Z23 Encounter for immunization: Secondary | ICD-10-CM

## 2016-08-27 DIAGNOSIS — Z91410 Personal history of adult physical and sexual abuse: Secondary | ICD-10-CM

## 2016-08-27 DIAGNOSIS — R45851 Suicidal ideations: Principal | ICD-10-CM | POA: Diagnosis present

## 2016-08-27 DIAGNOSIS — Z79899 Other long term (current) drug therapy: Secondary | ICD-10-CM

## 2016-08-27 DIAGNOSIS — F419 Anxiety disorder, unspecified: Secondary | ICD-10-CM | POA: Diagnosis present

## 2016-08-27 DIAGNOSIS — R45850 Homicidal ideations: Secondary | ICD-10-CM | POA: Diagnosis present

## 2016-08-27 DIAGNOSIS — F339 Major depressive disorder, recurrent, unspecified: Secondary | ICD-10-CM | POA: Diagnosis present

## 2016-08-27 DIAGNOSIS — Z915 Personal history of self-harm: Secondary | ICD-10-CM

## 2016-08-27 HISTORY — DX: Depression, unspecified: F32.A

## 2016-08-27 HISTORY — DX: Anxiety disorder, unspecified: F41.9

## 2016-08-27 HISTORY — DX: Attention-deficit hyperactivity disorder, unspecified type: F90.9

## 2016-08-27 HISTORY — DX: Major depressive disorder, single episode, unspecified: F32.9

## 2016-08-27 LAB — WET PREP, GENITAL
Clue Cells Wet Prep HPF POC: NONE SEEN
Sperm: NONE SEEN
Trich, Wet Prep: NONE SEEN
YEAST WET PREP: NONE SEEN

## 2016-08-27 LAB — GC/CHLAMYDIA PROBE AMP (~~LOC~~) NOT AT ARMC
CHLAMYDIA, DNA PROBE: NEGATIVE
NEISSERIA GONORRHEA: NEGATIVE

## 2016-08-27 MED ORDER — SERTRALINE HCL 50 MG PO TABS
50.0000 mg | ORAL_TABLET | Freq: Every day | ORAL | Status: DC
  Administered 2016-08-27 – 2016-08-28 (×2): 50 mg via ORAL
  Filled 2016-08-27 (×5): qty 1

## 2016-08-27 MED ORDER — ALUM & MAG HYDROXIDE-SIMETH 200-200-20 MG/5ML PO SUSP: 30.0000 mL | ORAL | Status: DC | PRN

## 2016-08-27 MED ORDER — LOPERAMIDE HCL 2 MG PO CAPS
2.0000 mg | ORAL_CAPSULE | Freq: Four times a day (QID) | ORAL | Status: DC | PRN
  Administered 2016-08-27 – 2016-08-28 (×2): 2 mg via ORAL
  Filled 2016-08-27 (×2): qty 1

## 2016-08-27 MED ORDER — TRAZODONE HCL 50 MG PO TABS: 50.0000 mg | ORAL_TABLET | Freq: Every evening | ORAL | Status: DC | PRN

## 2016-08-27 MED ORDER — ACETAMINOPHEN 325 MG PO TABS: 650.0000 mg | ORAL_TABLET | Freq: Four times a day (QID) | ORAL | Status: DC | PRN

## 2016-08-27 MED ORDER — HYDROXYZINE HCL 25 MG PO TABS: 25.0000 mg | ORAL_TABLET | Freq: Three times a day (TID) | ORAL | Status: DC | PRN

## 2016-08-27 MED ORDER — INFLUENZA VAC SPLIT QUAD 0.5 ML IM SUSY
0.5000 mL | PREFILLED_SYRINGE | INTRAMUSCULAR | Status: AC
  Administered 2016-08-29: 0.5 mL via INTRAMUSCULAR
  Filled 2016-08-27: qty 0.5

## 2016-08-27 MED ORDER — MAGNESIUM HYDROXIDE 400 MG/5ML PO SUSP: 30.0000 mL | Freq: Every day | ORAL | Status: DC | PRN

## 2016-08-27 MED ORDER — TRIAMCINOLONE ACETONIDE 0.5 % EX CREA
1.0000 "application " | TOPICAL_CREAM | Freq: Two times a day (BID) | CUTANEOUS | Status: DC
  Administered 2016-08-27 – 2016-08-30 (×7): 1 via TOPICAL
  Filled 2016-08-27 (×3): qty 15

## 2016-08-27 NOTE — ED Notes (Signed)
[facility]

## 2016-08-27 NOTE — Progress Notes (Signed)
Recreation Therapy Notes  Date: 08/27/16 Time: 0930 Location: 300 Hall Group Room  Group Topic: Stress Management  Goal Area(s) Addresses:  Patient will verbalize importance of using healthy stress management.  Patient will identify positive emotions associated with healthy stress management.   Behavioral Response: Engaged  Intervention: Stress Management  Activity :  Progressive Muscle Relaxation. LRT introduced that stress management technique of progressive muscle relaxation.  LRT read a script to allow patients to engage in the activity.  Patients were to follow along as LRT read script.  Education:  Stress Management, Discharge Planning.   Education Outcome: Acknowledges edcuation/In group clarification offered/Needs additional education  Clinical Observations/Feedback: Pt attended group.   Caroll RancherMarjette Ehan Freas, LRT/CTRS         Caroll RancherLindsay, Rony Ratz A 08/27/2016 11:53 AM

## 2016-08-27 NOTE — ED Notes (Signed)
[facility] called for transport. 
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
## 2016-08-27 NOTE — BH Assessment (Signed)
[facility] 07[facility]30 on 08/27/16. Accepting provider is Nira ConnJason Berry, FNP and attending provider will be Dr. Mckinley Jewelates. Call to report is [phone].  Princess BruinsAquicha Duff, MSW, Theresia MajorsLCSWA

## 2016-08-27 NOTE — ED Provider Notes (Signed)
[facility]-EMERGENCY DEPT Provider Note   CSN: [id number] Arrival date & time: 08/26/16  2159  By signing my name below, I, [name], attest that this documentation has been prepared under the direction and in the presence of Cynthia CrumbleAdeleke Markeya Mincy, MD. Electronically Signed: [name], ED Scribe. 08/27/16. 1:40 AM.  History   Chief Complaint Chief Complaint  Patient presents with  . Medical Clearance  . Vaginal Bleeding    HPI Cynthia Hoffman is a 20 y.o. female who presents to the Emergency Department complaining of medical clearance onset PTA. Pt notes that she had to have an emergency vaginal abortion (D&C) due to the first treatment with the pill failing 6 days ago. Pt is having associated symptoms of vaginal bleeding x 6 days. Pt reports that her vaginal bleeding is due to the abortion only and she notes that [facility]. She notes that she has not tried any medications for the relief of her symptoms. She denies any other symptoms.    The history is provided by the patient. No language interpreter was used.    History reviewed. No pertinent past medical history.  There are no active problems to display for this patient.   History reviewed. No pertinent surgical history.  OB History    Gravida Para Term Preterm AB Living   1             SAB TAB Ectopic Multiple Live Births                   Home Medications    Prior to Admission medications   Medication Sig Start Date End Date Taking? Authorizing Provider  triamcinolone cream (KENALOG) 0.5 % Apply 1 application topically 2 (two) times daily.   Yes Historical Provider, MD    Family History No family history on file.  Social History Social History  Substance Use Topics  . Smoking status: Never Smoker  . Smokeless tobacco: Never Used  . Alcohol use No     Allergies   Review of patient's allergies indicates no known allergies.   Review of Systems Review of Systems A complete 10 system review of  systems was obtained and all systems are negative except as noted in the HPI and PMH.   Physical Exam Updated Vital Signs BP 131/76 (BP Location: Left Arm)   Pulse 71   Temp 98.3 F (36.8 C) (Oral)   Resp 18   Ht 5\' 4"  (1.626 m)   Wt 190 lb (86.2 kg)   LMP  (LMP Unknown)   SpO2 100%   Breastfeeding? Unknown   BMI 32.61 kg/m   Physical Exam  Constitutional: She is oriented to person, place, and time. She appears well-developed and well-nourished. No distress.  HENT:  Head: Normocephalic and atraumatic.  Nose: Nose normal.  Mouth/Throat: Oropharynx is clear and moist. No oropharyngeal exudate.  Eyes: Conjunctivae and EOM are normal. Pupils are equal, round, and reactive to light. No scleral icterus.  Neck: Normal range of motion. Neck supple. No JVD present. No tracheal deviation present. No thyromegaly present.  Cardiovascular: Normal rate, regular rhythm and normal heart sounds.  Exam reveals no gallop and no friction rub.   No murmur heard. Pulmonary/Chest: Effort normal and breath sounds normal. No respiratory distress. She has no wheezes. She exhibits no tenderness.  Abdominal: Soft. Bowel sounds are normal. She exhibits no distension and no mass. There is no tenderness. There is no rebound and no guarding.  Genitourinary: Cervix exhibits no  motion tenderness. Right adnexum displays no tenderness. Left adnexum displays no tenderness. No bleeding in the vagina.  Genitourinary Comments: Chaperone present for exam. No vaginal bleeding. Mild cervical discharge. No CMT or adnexal tenderness.   Musculoskeletal: Normal range of motion. She exhibits no edema or tenderness.  Lymphadenopathy:    She has no cervical adenopathy.  Neurological: She is alert and oriented to person, place, and time. No cranial nerve deficit. She exhibits normal muscle tone.  Skin: Skin is warm and dry. No rash noted. No erythema. No pallor.  Nursing note and vitals reviewed.    ED Treatments / Results    DIAGNOSTIC STUDIES: Oxygen Saturation is 100% on RA, nl by my interpretation.    COORDINATION OF CARE: 1:38 AM Discussed treatment plan with pt at bedside which includes labs, wet prep, UA, GC/Chlamydia probe, and pt agreed to plan.   Labs (all labs ordered are listed, but only abnormal results are displayed) Labs Reviewed  WET PREP, GENITAL - Abnormal; Notable for the following:       Result Value   WBC, Wet Prep HPF POC MANY (*)    All other components within normal limits  ACETAMINOPHEN LEVEL - Abnormal; Notable for the following:    Acetaminophen (Tylenol), Serum <10 (*)    All other components within normal limits  CBC - Abnormal; Notable for the following:    Hemoglobin 11.7 (*)    All other components within normal limits  RAPID URINE DRUG SCREEN, HOSP PERFORMED - Abnormal; Notable for the following:    Tetrahydrocannabinol POSITIVE (*)    All other components within normal limits  COMPREHENSIVE METABOLIC PANEL  ETHANOL  SALICYLATE LEVEL  GC/CHLAMYDIA PROBE AMP ([place]) NOT [facility]    Procedures Procedures (including critical care time)  Medications Ordered in ED Medications - No data to display   Initial Impression / Assessment and Plan / ED Course  I have reviewed the triage vital signs and the nursing notes.  Pertinent labs that were available during my care of the patient were reviewed by me and considered in my medical decision making (see chart for details).  Clinical Course    Patient presents to the ED from behavioral health for medical clearance for vaginal bleeding.  Labs were obtained and were unremarkable.  Pelvic exam does not reveal any blood at all in the vagina or coming from the cervix.  Wet prep is negative other than WBC.  Patient is medically clear and safe for DC back to behavioral health.  Final Clinical Impressions(s) / ED Diagnoses   Final diagnoses:  Medical clearance for psychiatric admission    New Prescriptions Current  Discharge Medication List      I personally performed the services described in this documentation, which was scribed in my presence. The recorded information has been reviewed and is accurate.       Cynthia CrumbleAdeleke Kailene Steinhart, MD 08/27/16 [id number]
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
## 2016-08-27 NOTE — ED Notes (Signed)
Bed: WA05 Expected date:  Expected time:  Means of arrival:  Comments: 

## 2016-08-27 NOTE — Progress Notes (Signed)
Mrs. Cynthia Hoffman [facility] by [facility] sister.  She c/o having suicidial thoughts, prior attempts, depression, anxiety and feeling very overwhelmed.  [facility] [place].  She took a morning after pill that "didn't work" so she then had the abortion.  She also has a recent event of getting an STD.  She sts she smokes weed and takes pain pills to "get high to escape reality."  She lives [place] in [place] and attends [employer].  She was recently fired from her job due to being "sick & unstable."

## 2016-08-27 NOTE — Progress Notes (Signed)
Patient ID: Cynthia Hoffman, female   DOB: 03-13-1996, 20 y.o.   MRN: [id number]  Pt currently presents with an animated affect and anxious behavior. Pt reports to Clinical research associatewriter that she had no goal for today. Reports passive "self harm" thoughts. When asked about a plan, pt states "you guys ask that here, I guess I do." Pt refused to elaborate. Pt reports good sleep with current medication regimen.   Pt provided with medications per providers orders. Pt's labs and vitals were monitored throughout the night. Pt supported emotionally and encouraged to express concerns and questions. Pt educated on medications and suicide precaution safety at [facility].  Pt's safety ensured with 15 minute and environmental checks. Pt remains in the dayroom on the phone for most of the night, limited interaction with other patients. Pt currently denies HI and A/V hallucinations. Pt verbally agrees to seek staff if HI or A/VH occurs and to consult with staff before acting on any harmful thoughts. Will continue POC.

## 2016-08-27 NOTE — Discharge Instructions (Signed)
Ms. Cynthia Hoffman is medically cleared for psychiatric evaluation.  There is no evidence of vaginal bleeding on exam.

## 2016-08-27 NOTE — Progress Notes (Signed)
Pt attended the evening AA speaker meeting. Pt was engaged and participated. 08/27/16 10:01 PM Cynthia Hoffman C, NT 

## 2016-08-28 DIAGNOSIS — F332 Major depressive disorder, recurrent severe without psychotic features: Secondary | ICD-10-CM | POA: Diagnosis not present

## 2016-08-28 DIAGNOSIS — Z79899 Other long term (current) drug therapy: Secondary | ICD-10-CM | POA: Diagnosis not present

## 2016-08-28 MED ORDER — CLOTRIMAZOLE 1 % EX CREA: TOPICAL_CREAM | Freq: Two times a day (BID) | CUTANEOUS | Status: DC

## 2016-08-28 MED ORDER — MIRTAZAPINE 15 MG PO TABS
7.5000 mg | ORAL_TABLET | Freq: Every day | ORAL | Status: DC
  Administered 2016-08-28: 7.5 mg via ORAL
  Filled 2016-08-28: qty 1
  Filled 2016-08-28 (×2): qty 0.5

## 2016-08-28 NOTE — [facility] Group Notes (Signed)
Adult Group Therapy Note  Date:  08/28/2016  Time: 10:00AM-11:15AM  Group Topic/Focus: Today's group focused on the topic of fear and healthy coping skills.  An exercise was performed which elicited sources of fear that various patients feel, giving an opportunity for other patients to identify with that fear.  After a discussion of each, a coping skill was named that could be attempted in the future when such a fear arises.    Participation Level:  Active  Participation Quality:  Attentive and Sharing  Affect:  Blunted and Depressed  Cognitive:  Appropriate  Insight: Appropriate  Engagement in Group:  Engaged  Modes of Intervention:  Activity, Discussion and Support  Additional Comments:  The patient expressed little in the first part of group, but eventually shared her whole story and received much feedback of support as a result.  Carloyn JaegerMareida J Grossman-Orr 08/28/2016 , 1:15 PM

## 2016-08-28 NOTE — Progress Notes (Signed)
D: Pt. is up and visible in the milieu, seen resting in the dayroom. Denies having any SI/HI/AVH/Pain at this time. Pt. presents with a depressed & anxious affect and mood. Pt. states "I like to know my meds. because of the side effects". Pt. was provided with med. handouts earlier this evening. Pt. was minimal & guarded with interaction.   A: Encouragement and support given. Meds. ordered and given.   R: 1:1 interaction in private. Safety maintained with Q 15 checks. Continues to follow treatment plan and will monitor closely. No questions/concerns at this time.

## 2016-08-28 NOTE — Progress Notes (Signed)
[facility] MD Progress Note  08/28/2016 2:28 PM Cynthia Hoffman  MRN:  [id number] Subjective:  Cynthia Hoffman, 20 yo, came in reporting suicidal ideation without specific plan and she reports homicidal ideation with specific plan to harm the man who got her pregnant if released.  Today, she is not verbalizing SI and HI.  She is concerned that she gets angry easily. She is questioning if she has Bipolar mood disorder.    Principal Problem: Major depression, recurrent ([place]) Diagnosis:   Patient Active Problem List   Diagnosis Date Noted  . Major depression, recurrent ([place]) [F33.9] 08/27/2016  . Alcohol dependence ([place]) [F10.20] 08/27/2016   Total Time spent with patient: 30 minutes  Past Psychiatric History: see HPI  Past Medical History:  Past Medical History:  Diagnosis Date  . ADHD   . Anxiety   . Depression     Past Surgical History:  Procedure Laterality Date  . INDUCED ABORTION N/A 08/21/2016   [facility] [place]   Family History: History reviewed. No pertinent family history. Family Psychiatric  History: see HPI  Social History:  History  Alcohol Use  . Yes     History  Drug Use  . Types: Marijuana, Oxycodone    Social History   Social History  . Marital status: Single    Spouse name: N/A  . Number of children: N/A  . Years of education: N/A   Social History Main Topics  . Smoking status: Never Smoker  . Smokeless tobacco: Never Used  . Alcohol use Yes  . Drug use:     Types: Marijuana, Oxycodone  . Sexual activity: Yes    Birth control/ protection: None   Other Topics Concern  . None   Social History Narrative  . None   Additional Social History:                         Sleep: Fair  Appetite:  Fair  Current Medications: Current Facility-Administered Medications  Medication Dose Route Frequency Provider Last Rate Last Dose  . acetaminophen (TYLENOL) tablet 650 mg  650 mg Oral Q6H PRN Rozetta Nunnery, NP       . alum & mag hydroxide-simeth (MAALOX/MYLANTA) 200-200-20 MG/5ML suspension 30 mL  30 mL Oral Q4H PRN Rozetta Nunnery, NP      . hydrOXYzine (ATARAX/VISTARIL) tablet 25 mg  25 mg Oral TID PRN Rozetta Nunnery, NP      . Influenza vac split quadrivalent PF (FLUARIX) injection 0.5 mL  0.5 mL Intramuscular Tomorrow-1000 Fernando A Cobos, MD      . loperamide (IMODIUM) capsule 2 mg  2 mg Oral Q6H PRN Kerrie Buffalo, NP   2 mg at 08/28/16 1326  . magnesium hydroxide (MILK OF MAGNESIA) suspension 30 mL  30 mL Oral Daily PRN Rozetta Nunnery, NP      . sertraline (ZOLOFT) tablet 50 mg  50 mg Oral Daily Linard Millers, MD   50 mg at 08/28/16 1246  . traZODone (DESYREL) tablet 50 mg  50 mg Oral QHS PRN Rozetta Nunnery, NP      . triamcinolone cream (KENALOG) 0.5 % 1 application  1 application Topical BID Rozetta Nunnery, NP   1 application at 88/11/03 1246    Lab Results:  Results for orders placed or performed during the hospital encounter of 08/26/16 (from the past 48 hour(s))  Rapid urine drug screen (hospital performed)  Status: Abnormal   Collection Time: 08/26/16 10:36 PM  Result Value Ref Range   Opiates NONE DETECTED NONE DETECTED   Cocaine NONE DETECTED NONE DETECTED   Benzodiazepines NONE DETECTED NONE DETECTED   Amphetamines NONE DETECTED NONE DETECTED   Tetrahydrocannabinol POSITIVE (A) NONE DETECTED   Barbiturates NONE DETECTED NONE DETECTED    Comment:        DRUG SCREEN FOR MEDICAL PURPOSES ONLY.  IF CONFIRMATION IS NEEDED FOR ANY PURPOSE, NOTIFY LAB WITHIN 5 DAYS.        LOWEST DETECTABLE LIMITS FOR URINE DRUG SCREEN Drug Class       Cutoff (ng/mL) Amphetamine      1000 Barbiturate      200 Benzodiazepine   993 Tricyclics       570 Opiates          300 Cocaine          300 THC              50   Comprehensive metabolic panel     Status: None   Collection Time: 08/26/16 10:40 PM  Result Value Ref Range   Sodium 138 135 - 145 mmol/L   Potassium 3.5 3.5 - 5.1 mmol/L    Chloride 107 101 - 111 mmol/L   CO2 25 22 - 32 mmol/L   Glucose, Bld 92 65 - 99 mg/dL   BUN 7 6 - 20 mg/dL   Creatinine, Ser 0.71 0.44 - 1.00 mg/dL   Calcium 9.0 8.9 - 10.3 mg/dL   Total Protein 7.3 6.5 - 8.1 g/dL   Albumin 3.7 3.5 - 5.0 g/dL   AST 17 15 - 41 U/L   ALT 19 14 - 54 U/L   Alkaline Phosphatase 56 38 - 126 U/L   Total Bilirubin 0.4 0.3 - 1.2 mg/dL   GFR calc non Af Amer >60 >60 mL/min   GFR calc Af Amer >60 >60 mL/min    Comment: (NOTE) The eGFR has been calculated using the CKD EPI equation. This calculation has not been validated in all clinical situations. eGFR's persistently <60 mL/min signify possible Chronic Kidney Disease.    Anion gap 6 5 - 15  Ethanol     Status: None   Collection Time: 08/26/16 10:40 PM  Result Value Ref Range   Alcohol, Ethyl (B) <5 <5 mg/dL    Comment:        LOWEST DETECTABLE LIMIT FOR SERUM ALCOHOL IS 5 mg/dL FOR MEDICAL PURPOSES ONLY   Salicylate level     Status: None   Collection Time: 08/26/16 10:40 PM  Result Value Ref Range   Salicylate Lvl <1.7 2.8 - 30.0 mg/dL  Acetaminophen level     Status: Abnormal   Collection Time: 08/26/16 10:40 PM  Result Value Ref Range   Acetaminophen (Tylenol), Serum <10 (L) 10 - 30 ug/mL    Comment:        THERAPEUTIC CONCENTRATIONS VARY SIGNIFICANTLY. A RANGE OF 10-30 ug/mL MAY BE AN EFFECTIVE CONCENTRATION FOR MANY PATIENTS. HOWEVER, SOME ARE BEST TREATED AT CONCENTRATIONS OUTSIDE THIS RANGE. ACETAMINOPHEN CONCENTRATIONS >150 ug/mL AT 4 HOURS AFTER INGESTION AND >50 ug/mL AT 12 HOURS AFTER INGESTION ARE OFTEN ASSOCIATED WITH TOXIC REACTIONS.   cbc     Status: Abnormal   Collection Time: 08/26/16 10:40 PM  Result Value Ref Range   WBC 9.2 4.0 - 10.5 K/uL   RBC 4.41 3.87 - 5.11 MIL/uL   Hemoglobin 11.7 (L) 12.0 - 15.0 g/dL  HCT 36.1 36.0 - 46.0 %   MCV 81.9 78.0 - 100.0 fL   MCH 26.5 26.0 - 34.0 pg   MCHC 32.4 30.0 - 36.0 g/dL   RDW 13.4 11.5 - 15.5 %   Platelets 324 150 -  400 K/uL  GC/Chlamydia probe amp ([place])not at [facility]     Status: None   Collection Time: 08/27/16 12:00 AM  Result Value Ref Range   Chlamydia Negative     Comment: Normal Reference Range - Negative   Neisseria gonorrhea Negative     Comment: Normal Reference Range - Negative  Wet prep, genital     Status: Abnormal   Collection Time: 08/27/16  1:52 AM  Result Value Ref Range   Yeast Wet Prep HPF POC NONE SEEN NONE SEEN    Comment: Swab received with less than 0.5 mL of saline, saline added to specimen, interpret results with caution.   Trich, Wet Prep NONE SEEN NONE SEEN   Clue Cells Wet Prep HPF POC NONE SEEN NONE SEEN   WBC, Wet Prep HPF POC MANY (A) NONE SEEN   Sperm NONE SEEN     Blood Alcohol level:  Lab Results  Component Value Date   ETH <5 94/70/9628    Metabolic Disorder Labs: No results found for: HGBA1C, MPG No results found for: PROLACTIN No results found for: CHOL, TRIG, HDL, CHOLHDL, VLDL, LDLCALC  Physical Findings: AIMS:  , ,  ,  ,    CIWA:    COWS:     Musculoskeletal: Strength & Muscle Tone: within normal limits Gait & Station: normal Patient leans: N/A  Psychiatric Specialty Exam: Physical Exam  Nursing note and vitals reviewed. Psychiatric: She has a normal mood and affect. Her speech is normal. Thought content is not paranoid. She expresses no homicidal and no suicidal ideation.    Review of Systems  Constitutional: Negative.   HENT: Negative.   Eyes: Negative.   Respiratory: Negative.   Cardiovascular: Negative.   Gastrointestinal: Positive for abdominal pain and diarrhea.  Skin: Negative.   Neurological: Negative.   Endo/Heme/Allergies: Negative.   Psychiatric/Behavioral: Positive for depression.  All other systems reviewed and are negative.   Blood pressure 118/77, pulse (!) 115, temperature 98.3 F (36.8 C), temperature source Oral, resp. rate 16, height 5' 4"  (1.626 m), weight 86.2 kg (190 lb), last menstrual period 06/01/2016,  SpO2 99 %, unknown if currently breastfeeding.Body mass index is 32.61 kg/m.  General Appearance: Casual  Eye Contact:  Good  Speech:  Clear and Coherent  Volume:  Normal  Mood:  Anxious and Dysphoric  Affect:  Congruent and Labile  Thought Process:  Coherent  Orientation:  Full (Time, Place, and Person)  Thought Content:  Negative  Suicidal Thoughts:  No  Homicidal Thoughts:  No  Memory:  Immediate;   Fair Recent;   Fair Remote;   Fair  Judgement:  Impaired  Insight:  Fair  Psychomotor Activity:  Normal  Concentration:  Concentration: Fair and Attention Span: Fair  Recall:  Good  Fund of Knowledge:  Good  Language:  Good  Akathisia:  Negative  Handed:  Right  AIMS (if indicated):     Assets:  Desire for Improvement Resilience Social Support  ADL's:  Intact  Cognition:  WNL  Sleep:  Number of Hours: 5.75   Treatment Plan Summary: Review of chart, vital signs, medications, and notes.  1-Individual and group therapy  2-Medication management for depression and anxiety: Medications reviewed with the patient.  Zoloft dc as patient developed diarrhea.  Added Remeron 7. 5 Mg depression/anxiety Will give her GI system a rest and re assess in the morning.  She has only tried Zoloft and Prozac in the past.  Discussed a trial of alternative antidepressant and a mood stabilizer for mood lability.   3-Coping skills for depression, anxiety  4-Continue crisis stabilization and management  5-Address health issues--monitoring vital signs, stable  6-Treatment plan in progress to prevent relapse of depression and anxiety  Janett Labella, NP [facility] 08/28/2016, 2:28 PM
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
## 2016-08-28 NOTE — Plan of Care (Signed)
Problem: Safety: Goal: Periods of time without injury will increase Outcome: Progressing Pt. remains a low fall risk, denies SI/HI at this time, Q 15 checks in place.    
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
## 2016-08-28 NOTE — [facility] Suicide Risk Assessment (Signed)
[facility] INPATIENT:  Family/Significant Other Suicide Prevention Education  Suicide Prevention Education:  Patient Refusal for Family/Significant Other Suicide Prevention Education: The patient Cynthia Hoffman has refused to provide written consent for family/significant other to be provided Family/Significant Other Suicide Prevention Education during admission and/or prior to discharge.  Physician notified.  Brochure given and reviewed with patient.  Carloyn JaegerMareida J Grossman-Orr 08/28/2016, 10:01 AM

## 2016-08-28 NOTE — [facility] Group Notes (Signed)
Late entry from 08/27/16:      [facility]  1:15 PM Type of Therapy: Group Therapy Participation Level: Minimal  Participation Quality: Attentive but limited  Affect: Depressed and Flat  Cognitive: Alert and Oriented  Insight: Developing/Improving and Engaged  Engagement in Therapy: Developing/Improving and Engaged  Modes of Intervention: Clarification, Confrontation, Discussion, Education, Exploration, Limit-setting, Orientation, Problem-solving, Rapport Building, Dance movement psychotherapisteality Testing, Socialization and Support  Summary of Progress/Problems: The topic for today was feelings about relapse. Pt discussed what relapse prevention is to them and identified triggers that they are on the path to relapse. Pt processed their feeling towards relapse and was able to relate to peers. Pt discussed coping skills that can be used for relapse prevention. Patient did not participate in discussion despite CSW encouragement. She was observed actively listening during discussion.    Samuella BruinKristin Olon Russ, MSW, LCSW Clinical Social Worker [facility] [phone]

## 2016-08-28 NOTE — Progress Notes (Signed)
D Deanza is seen OOB UAL on the 300 hall today. Slightly nervous this morning but less so late this evening. A SHe completed her am assessment and writes  on it she denies SI rates her depression, hopelessness and anxiety  " 6/0/ " , respectively . She took her 2nd dose of po zoloft today and became very nauseated, zoloft was dc'd by dr Elna Bresloweappen  And abilify and effexor were ordered instead. R Safety in place an dpoc con.

## 2016-08-28 NOTE — Progress Notes (Signed)
Pt attended the evening AA speaker meeting. Pt was engaged.  Jalexa Pifer C, NT 08/28/16 8:28 PM  

## 2016-08-29 DIAGNOSIS — Z79899 Other long term (current) drug therapy: Secondary | ICD-10-CM | POA: Diagnosis not present

## 2016-08-29 DIAGNOSIS — F332 Major depressive disorder, recurrent severe without psychotic features: Secondary | ICD-10-CM

## 2016-08-29 MED ORDER — TRAZODONE HCL 100 MG PO TABS: 100.0000 mg | ORAL_TABLET | Freq: Every evening | ORAL | Status: DC | PRN

## 2016-08-29 MED ORDER — MIRTAZAPINE 15 MG PO TABS
15.0000 mg | ORAL_TABLET | Freq: Every day | ORAL | Status: DC
  Administered 2016-08-29 – 2016-08-31 (×3): 15 mg via ORAL
  Filled 2016-08-29 (×2): qty 1
  Filled 2016-08-29: qty 14
  Filled 2016-08-29 (×2): qty 1
  Filled 2016-08-29: qty 14

## 2016-08-29 NOTE — Progress Notes (Signed)
[facility] MD Progress Note  08/29/2016 10:58 AM Cynthia Hoffman  MRN:  [id number]  Subjective: Patient reports " I am not able to sleep throughout the night."  - Patient is not interested in adding an additional antidepressant.  Objective:Cynthia Hoffman is awake, alert and oriented X4 , seen resting in bedroom  Denies suicidal or homicidal ideation during this assessment. Denies auditory or visual hallucination and does not appear to be responding to internal stimuli.  Patient appears to be minimizing depressive symptoms.  Patient reports she is medication compliant without mediation side effects that she is able to recall. Patient reports her diarrhea has resolved.  States her depression 2/10. Reports fair appetite and reports resting well.Support, encouragement and reassurance was provided.   Principal Problem: Major depression, recurrent ([place]) Diagnosis:   Patient Active Problem List   Diagnosis Date Noted  . Major depression, recurrent ([place]) [F33.9] 08/27/2016  . Alcohol dependence ([place]) [F10.20] 08/27/2016   Total Time spent with patient: 30 minutes  Past Psychiatric History: see HPI  Past Medical History:  Past Medical History:  Diagnosis Date  . ADHD   . Anxiety   . Depression     Past Surgical History:  Procedure Laterality Date  . INDUCED ABORTION N/A 08/21/2016   [facility] [place]   Family History: History reviewed. No pertinent family history. Family Psychiatric  History: see HPI  Social History:  History  Alcohol Use  . Yes     History  Drug Use  . Types: Marijuana, Oxycodone    Social History   Social History  . Marital status: Single    Spouse name: N/A  . Number of children: N/A  . Years of education: N/A   Social History Main Topics  . Smoking status: Never Smoker  . Smokeless tobacco: Never Used  . Alcohol use Yes  . Drug use:     Types: Marijuana, Oxycodone  . Sexual activity: Yes    Birth control/ protection: None    Other Topics Concern  . None   Social History Narrative  . None   Additional Social History:                         Sleep: Fair  Appetite:  Fair  Current Medications: Current Facility-Administered Medications  Medication Dose Route Frequency Provider Last Rate Last Dose  . acetaminophen (TYLENOL) tablet 650 mg  650 mg Oral Q6H PRN Rozetta Nunnery, NP      . alum & mag hydroxide-simeth (MAALOX/MYLANTA) 200-200-20 MG/5ML suspension 30 mL  30 mL Oral Q4H PRN Rozetta Nunnery, NP      . hydrOXYzine (ATARAX/VISTARIL) tablet 25 mg  25 mg Oral TID PRN Rozetta Nunnery, NP      . Influenza vac split quadrivalent PF (FLUARIX) injection 0.5 mL  0.5 mL Intramuscular Tomorrow-1000 Fernando A Cobos, MD      . loperamide (IMODIUM) capsule 2 mg  2 mg Oral Q6H PRN Kerrie Buffalo, NP   2 mg at 08/28/16 1326  . magnesium hydroxide (MILK OF MAGNESIA) suspension 30 mL  30 mL Oral Daily PRN Rozetta Nunnery, NP      . sertraline (ZOLOFT) tablet 50 mg  50 mg Oral Daily Linard Millers, MD   50 mg at 08/28/16 1246  . traZODone (DESYREL) tablet 50 mg  50 mg Oral QHS PRN Rozetta Nunnery, NP      . triamcinolone cream (KENALOG) 0.5 %  1 application  1 application Topical BID Rozetta Nunnery, NP   1 application at 80/03/49 1246    Lab Results:  Results for orders placed or performed during the hospital encounter of 08/26/16 (from the past 48 hour(s))  Rapid urine drug screen (hospital performed)     Status: Abnormal   Collection Time: 08/26/16 10:36 PM  Result Value Ref Range   Opiates NONE DETECTED NONE DETECTED   Cocaine NONE DETECTED NONE DETECTED   Benzodiazepines NONE DETECTED NONE DETECTED   Amphetamines NONE DETECTED NONE DETECTED   Tetrahydrocannabinol POSITIVE (A) NONE DETECTED   Barbiturates NONE DETECTED NONE DETECTED    Comment:        DRUG SCREEN FOR MEDICAL PURPOSES ONLY.  IF CONFIRMATION IS NEEDED FOR ANY PURPOSE, NOTIFY LAB WITHIN 5 DAYS.        LOWEST DETECTABLE LIMITS FOR  URINE DRUG SCREEN Drug Class       Cutoff (ng/mL) Amphetamine      1000 Barbiturate      200 Benzodiazepine   179 Tricyclics       150 Opiates          300 Cocaine          300 THC              50   Comprehensive metabolic panel     Status: None   Collection Time: 08/26/16 10:40 PM  Result Value Ref Range   Sodium 138 135 - 145 mmol/L   Potassium 3.5 3.5 - 5.1 mmol/L   Chloride 107 101 - 111 mmol/L   CO2 25 22 - 32 mmol/L   Glucose, Bld 92 65 - 99 mg/dL   BUN 7 6 - 20 mg/dL   Creatinine, Ser 0.71 0.44 - 1.00 mg/dL   Calcium 9.0 8.9 - 10.3 mg/dL   Total Protein 7.3 6.5 - 8.1 g/dL   Albumin 3.7 3.5 - 5.0 g/dL   AST 17 15 - 41 U/L   ALT 19 14 - 54 U/L   Alkaline Phosphatase 56 38 - 126 U/L   Total Bilirubin 0.4 0.3 - 1.2 mg/dL   GFR calc non Af Amer >60 >60 mL/min   GFR calc Af Amer >60 >60 mL/min    Comment: (NOTE) The eGFR has been calculated using the CKD EPI equation. This calculation has not been validated in all clinical situations. eGFR's persistently <60 mL/min signify possible Chronic Kidney Disease.    Anion gap 6 5 - 15  Ethanol     Status: None   Collection Time: 08/26/16 10:40 PM  Result Value Ref Range   Alcohol, Ethyl (B) <5 <5 mg/dL    Comment:        LOWEST DETECTABLE LIMIT FOR SERUM ALCOHOL IS 5 mg/dL FOR MEDICAL PURPOSES ONLY   Salicylate level     Status: None   Collection Time: 08/26/16 10:40 PM  Result Value Ref Range   Salicylate Lvl <5.6 2.8 - 30.0 mg/dL  Acetaminophen level     Status: Abnormal   Collection Time: 08/26/16 10:40 PM  Result Value Ref Range   Acetaminophen (Tylenol), Serum <10 (L) 10 - 30 ug/mL    Comment:        THERAPEUTIC CONCENTRATIONS VARY SIGNIFICANTLY. A RANGE OF 10-30 ug/mL MAY BE AN EFFECTIVE CONCENTRATION FOR MANY PATIENTS. HOWEVER, SOME ARE BEST TREATED AT CONCENTRATIONS OUTSIDE THIS RANGE. ACETAMINOPHEN CONCENTRATIONS >150 ug/mL AT 4 HOURS AFTER INGESTION AND >50 ug/mL AT 12 HOURS AFTER INGESTION  ARE  OFTEN ASSOCIATED WITH TOXIC REACTIONS.   cbc     Status: Abnormal   Collection Time: 08/26/16 10:40 PM  Result Value Ref Range   WBC 9.2 4.0 - 10.5 K/uL   RBC 4.41 3.87 - 5.11 MIL/uL   Hemoglobin 11.7 (L) 12.0 - 15.0 g/dL   HCT 36.1 36.0 - 46.0 %   MCV 81.9 78.0 - 100.0 fL   MCH 26.5 26.0 - 34.0 pg   MCHC 32.4 30.0 - 36.0 g/dL   RDW 13.4 11.5 - 15.5 %   Platelets 324 150 - 400 K/uL  GC/Chlamydia probe amp ([place])not at [facility]     Status: None   Collection Time: 08/27/16 12:00 AM  Result Value Ref Range   Chlamydia Negative     Comment: Normal Reference Range - Negative   Neisseria gonorrhea Negative     Comment: Normal Reference Range - Negative  Wet prep, genital     Status: Abnormal   Collection Time: 08/27/16  1:52 AM  Result Value Ref Range   Yeast Wet Prep HPF POC NONE SEEN NONE SEEN    Comment: Swab received with less than 0.5 mL of saline, saline added to specimen, interpret results with caution.   Trich, Wet Prep NONE SEEN NONE SEEN   Clue Cells Wet Prep HPF POC NONE SEEN NONE SEEN   WBC, Wet Prep HPF POC MANY (A) NONE SEEN   Sperm NONE SEEN     Blood Alcohol level:  Lab Results  Component Value Date   ETH <5 02/63/7858    Metabolic Disorder Labs: No results found for: HGBA1C, MPG No results found for: PROLACTIN No results found for: CHOL, TRIG, HDL, CHOLHDL, VLDL, LDLCALC  Physical Findings: AIMS:  , ,  ,  ,    CIWA:    COWS:     Musculoskeletal: Strength & Muscle Tone: within normal limits Gait & Station: normal Patient leans: N/A  Psychiatric Specialty Exam: Physical Exam  Nursing note and vitals reviewed. Psychiatric: She has a normal mood and affect. Her speech is normal. Thought content is not paranoid. She expresses no homicidal and no suicidal ideation.    Review of Systems  Constitutional: Negative.   HENT: Negative.   Eyes: Negative.   Respiratory: Negative.   Cardiovascular: Negative.   Gastrointestinal: Positive for  abdominal pain and diarrhea.  Skin: Negative.   Neurological: Negative.   Endo/Heme/Allergies: Negative.   Psychiatric/Behavioral: Positive for depression.  All other systems reviewed and are negative.   Blood pressure 118/77, pulse (!) 115, temperature 98.3 F (36.8 C), temperature source Oral, resp. rate 16, height 5' 4"  (1.626 m), weight 86.2 kg (190 lb), last menstrual period 06/01/2016, SpO2 99 %, unknown if currently breastfeeding.Body mass index is 32.61 kg/m.  General Appearance: Casual  Eye Contact:  Good  Speech:  Clear and Coherent  Volume:  Normal  Mood:  Anxious and Dysphoric  Affect:  Congruent and Labile  Thought Process:  Coherent  Orientation:  Full (Time, Place, and Person)  Thought Content:  Negative  Suicidal Thoughts:  No  Homicidal Thoughts:  No  Memory:  Immediate;   Fair Recent;   Fair Remote;   Fair  Judgement:  Impaired  Insight:  Fair  Psychomotor Activity:  Normal  Concentration:  Concentration: Fair and Attention Span: Fair  Recall:  Good  Fund of Knowledge:  Good  Language:  Good  Akathisia:  Negative  Handed:  Right  AIMS (if indicated):     Assets:  Desire for Improvement Resilience Social Support  ADL's:  Intact  Cognition:  WNL  Sleep:  Number of Hours: 5.75     I agree with current treatment plan on 08/29/2016, Patient seen face-to-face for psychiatric evaluation follow-up, chart reviewed. Reviewed the information documented and agree with the treatment plan.  Treatment Plan Summary: Review of chart, vital signs, medications, and notes.  1-Individual and group therapy  2-Medication management for depression and anxiety: Medications reviewed with the patient.   -Zoloft d/c as patient developed diarrhea.  Increased  Remeron 7. 60m to 121mdepression/anxiety Will give her GI system a rest and reassess in the morning.  She has only tried Zoloft and Prozac in the past.  Discussed a trial of alternative antidepressant and a mood  stabilizer for mood lability.   3-Coping skills for depression, anxiety  4-Continue crisis stabilization and management  5-Address health issues--monitoring vital signs, stable  6-Treatment plan in progress to prevent relapse of depression and anxiety  TaRicky AlaNP [facility]1/02/2016, 10:58 AM

## 2016-08-29 NOTE — Progress Notes (Signed)
Pt attended the evening AA speaker meeting. Pt was engaged and appropriate.  Caswell Corwinwen, Montoya Brandel C, NT 08/29/16 9:08 PM

## 2016-08-29 NOTE — [facility] Group Notes (Signed)
Adult Therapy Group Note  Date: 08/29/2016  Time:  10:00-11:00AM  Group Topic/Focus: Healthy Press photographerupport Systems   Building Self Esteem:   The focus of this group was to assist patients in identifying their current healthy supports as well as to list and discuss other supports that can be put in place to help with achieving life goals.  An activity allowed small groups to discuss different supports, their pros and cons, and how to get started with that type of support.  These items included supports such as 12-step groups, individual therapy, psychiatrists, children, faith activities, accountability partner, sponsor, group therapy, support groups, classes on mental health, phone apps/YouTube, gym/exercise, and more.  Songs were  played at the beginning and at the end of group with a short discussion about the emotions evoked as well as how to use music as an additional support/coping mechanism.   Participation Level:  Active  Participation Quality:  Attentive and Sharing  Affect:  Blunted and Depressed  Cognitive:  Appropriate  Insight: Good  Engagement in Group:  Engaged  Modes of Intervention:  Activity, Discussion and Support  Additional Comments:  The patient expressed that a current health support is herself.  She participated appropriately in the activities.  Lynnell ChadMareida J Grossman-Orr, LCSW 08/29/2016  1:20 PM

## 2016-08-29 NOTE — Progress Notes (Signed)
D Canary remains sad, depressed and worried about   where I'm going when I leave here". She completed her daily assessment and on it she wrote she denied SI  Today and she rated her depression, hopelessness and anxiety " 2/6/1", respectively. . She is engaged in her Life SKills group today and this is evidenced by her affect, her questions and her statements about ehr self awareness. R Safety is in place. Therapeutic relationship contd to be fostered.

## 2016-08-30 DIAGNOSIS — F332 Major depressive disorder, recurrent severe without psychotic features: Secondary | ICD-10-CM | POA: Diagnosis not present

## 2016-08-30 DIAGNOSIS — Z813 Family history of other psychoactive substance abuse and dependence: Secondary | ICD-10-CM | POA: Diagnosis not present

## 2016-08-30 DIAGNOSIS — Z79899 Other long term (current) drug therapy: Secondary | ICD-10-CM | POA: Diagnosis not present

## 2016-08-30 LAB — URINALYSIS W MICROSCOPIC (NOT AT ARMC)
BACTERIA UA: NONE SEEN
BILIRUBIN URINE: NEGATIVE
GLUCOSE, UA: NEGATIVE mg/dL
Hgb urine dipstick: NEGATIVE
KETONES UR: NEGATIVE mg/dL
LEUKOCYTES UA: NEGATIVE
NITRITE: NEGATIVE
PH: 6.5 (ref 5.0–8.0)
Protein, ur: NEGATIVE mg/dL
RBC / HPF: NONE SEEN RBC/hpf (ref 0–5)
SPECIFIC GRAVITY, URINE: 1.006 (ref 1.005–1.030)
WBC, UA: NONE SEEN WBC/hpf (ref 0–5)

## 2016-08-30 NOTE — Progress Notes (Signed)
Patient attended AA group meeting tonight.  
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
## 2016-08-30 NOTE — Progress Notes (Signed)
D: Pt denies SI/HI/AVH. Pt is pleasant and cooperative. Patient was with minimal interaction with this Clinical research associatewriter. A: Pt was offered support and encouragement. Pt was given scheduled medications. Pt was encourage to attend groups. Q 15 minute checks were done for safety.  R:Pt attends groups. Pt is taking medication. Pt has no complaints.Pt receptive to treatment and safety maintained on unit.
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
## 2016-08-30 NOTE — [facility] Group Notes (Signed)
[facility] LCSW Group Therapy 08/30/2016  1:15 pm  Type of Therapy: Group Therapy Participation Level: Minimal  Participation Quality: Attentive  Affect: Blunted  Cognitive: Alert and Oriented  Insight: Developing/Improving and Engaged  Engagement in Therapy: Developing/Improving and Engaged  Modes of Intervention: Clarification, Confrontation, Discussion, Education, Exploration,  Limit-setting, Orientation, Problem-solving, Rapport Building, Dance movement psychotherapisteality Testing, Socialization and Support  Summary of Progress/Problems: Pt identified obstacles faced currently and processed barriers involved in overcoming these obstacles. Pt identified steps necessary for overcoming these obstacles and explored motivation (internal and external) for facing these difficulties head on. Pt further identified one area of concern in their lives and chose a goal to focus on for today. Patient did not participate in discussion but was observed actively listening.   Samuella BruinKristin Allexa Acoff, LCSW Clinical Social Worker [facility] [phone]

## 2016-08-30 NOTE — Progress Notes (Signed)
Recreation Therapy Notes  Date: 08/30/16 Time: 0930 Location: 300 Hall Group   Group Topic: Stress Management  Goal Area(s) Addresses:  Patient will verbalize importance of using healthy stress management.  Patient will identify positive emotions associated with healthy stress management.   Intervention: Stress Management  Activity :  Imagery to Connect with Feelings.  LRT introduced the stress management technique of guided imagery.  LRT read a script to allow the patients to examine and connect with their inner feelings.  Patients were to follow along and engaged as LRT read script.  Education:  Stress Management, Discharge Planning.   Education Outcome: Acknowledges edcuation/In group clarification offered/Needs additional education  Clinical Observations/Feedback: Pt did not attend group.     Caroll RancherMarjette Marq Rebello, LRT/CTRS         Caroll RancherLindsay, Jocie Meroney A 08/30/2016 12:21 PM

## 2016-08-30 NOTE — Progress Notes (Signed)
Nursing Note 08/30/2016 [id number]  Data Reports sleeping good with/without PRN sleep med.  Rates depression 2/10, hopelessness 1/10, and anxiety 10/10. Affect depressed.  Denies HI, SI, AVH.  Denies physical complaints.  Attending groups, spends most of free time in bedroom.  Action Spoke with patient 1:1, nurse offered support to patient throughout shift.  Continues to be monitored on 15 minute checks for safety.  Response Remains safe and appropriate though somewhat isolative on unit.

## 2016-08-31 DIAGNOSIS — Z79899 Other long term (current) drug therapy: Secondary | ICD-10-CM | POA: Diagnosis not present

## 2016-08-31 DIAGNOSIS — F332 Major depressive disorder, recurrent severe without psychotic features: Secondary | ICD-10-CM | POA: Diagnosis not present

## 2016-08-31 DIAGNOSIS — Z813 Family history of other psychoactive substance abuse and dependence: Secondary | ICD-10-CM | POA: Diagnosis not present

## 2016-08-31 MED ORDER — MIRTAZAPINE 15 MG PO TABS: 15.0000 mg | ORAL_TABLET | Freq: Every day | ORAL | 0 refills | Status: DC
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
## 2016-08-31 NOTE — Progress Notes (Signed)
CSW had message from Jackie Plumobin [facility]([employer] counselor) who was requesting information pertaining to patient's discharge plan. Patient declined to consent to contact with Zella BallRobin and stated "I'll call her myself." CSW provided pt with Robin's contact number. Patient continues to present with ambivalence pertaining to her upcoming discharge and plan of [facility]. Patient states "I have no other options that will work for me."   Trula SladeHeather Smart, MSW, LCSW Clinical Social Worker 08/31/2016 1:52 PM

## 2016-08-31 NOTE — Progress Notes (Signed)
  [facility] Adult Case Management Discharge Plan :  Will you be returning to the same living situation after discharge:  No. [facility]  At discharge, do you have transportation home?: Yes,  Driver from facility will pick her up at 11am-12pm on Wednesday. Do you have the ability to pay for your medications: Yes,  [employer] private insurance  Release of information consent forms completed and submitted to medical records by CSW.   Patient to Follow up at: Follow-up Information    [facility] Follow up on 09/01/2016.   Why:  Patient has been accepted for treatment at this facilty for Wednesday, September 01, 2016. Driver will pick up patient between 11am-12pm on this date. 30 day supply of medications required.  Contact information: [address] [place], [place] [address] Phone: [phone] Fax: [phone]          Next level of care provider has access to [facility] Link:no  Safety Planning and Suicide Prevention discussed: Yes,  SPE completed with pt; pt declined to consent to family contact.   Have you used any form of tobacco in the last 30 days? (Cigarettes, Smokeless Tobacco, Cigars, and/or Pipes): No  Has patient been referred to the Quitline?: N/A patient is not a smoker  Patient has been referred for addiction treatment: Yes  Ledell PeoplesHeather N Smart LCSW 08/31/2016, 1:59 PM

## 2016-08-31 NOTE — Progress Notes (Signed)
Recreation Therapy Notes  Animal-Assisted Activity (AAA) Program Checklist/Progress Notes Patient Eligibility Criteria Checklist & Daily Group note for Rec TxIntervention  Date: 11.07.2017 Time: 2:45pm Location: [address] Dayroom    AAA/T Program Assumption of Risk Form signed by Patient/ or Parent Legal Guardian Yes  Patient is free of allergies or sever asthma Yes  Patient reports no fear of animals Yes  Patient reports no history of cruelty to animals Yes  Patient understands his/her participation is voluntary Yes  Behavioral Response: Did not attend.   Cynthia Hoffman Emil Klassen, LRT/CTRS       [name] 08/31/2016 3:00 PM 

## 2016-08-31 NOTE — Progress Notes (Signed)
Patient ID: Cynthia HuskyJillian Hoffman, female   DOB: [birth year], 20 y.o.   MRN: [id number] D: Client visible on the unit, seen in dayroom watching TV. Client reports of her day "could have been better" depression "8" of 10. Client reports she was suppose to be going to treatment center tomorrow "but I called and cancelled it, I left a message with the lady, Cynthia Hoffman or somebody" "my family does think I need to go" Client reports "off and on" thoughts of harm, but verbally contracts for safety.  A: Writer provided emotional support, questioned if she had discussed this decision with the physician or SW? Client encouraged to speak with them about this decision as arrangements for discharge has been made. Medications reviewed, administered as ordered. Client encouraged to report any concerns. Staff will monitor q5815min for safety.

## 2016-08-31 NOTE — Progress Notes (Signed)
Pt has been observed in the dayroom most of the evening either watching TV or talking on the phone.  She reports that her day has been "ok".  She is still trying to decide what to do and where to go when she is discharged.  She would like to [facility] 28 day [facility] [address] [facility]  She says she has been talking to the CSW about referrals.  She denies SI/HI/AVH.  She denies having any withdrawal symptoms at this time.  Pt is pleasant and cooperative with staff.  Support and encouragement offered.  Discharge plans are in process.  Safety maintained with q15 minute checks.

## 2016-08-31 NOTE — Plan of Care (Signed)
Problem: Coping: Goal: Ability to verbalize frustrations and anger appropriately will improve Outcome: Progressing Nurse discussed depressed, anxiety/coping skills with patient.

## 2016-08-31 NOTE — B[facility] Group Notes (Signed)
[facility] Group Notes: (Nursing/MHT/Case Management/Adjunct)  Date: 08/31/2016 Time: 10:47 AM  Type of Therapy: Psychoeducational Skills  Participation Level: Did Not Attend  Participation Quality: Did not attend   Affect:Did not attend   Cognitive:Did not attend'  Insight: None  Engagement in Group: None  Modes of Intervention:Discussion and Education  Summary of Progress/Problems: Patient did not attend group.  

## 2016-08-31 NOTE — B[facility] Group Notes (Signed)
B[facility] LCSW Group Therapy  08/31/2016 2:21 PM  Type of Therapy:  Group Therapy  Participation Level:  Did Not Attend-pt chose to remain in bed. Invited.   Summary of Progress/Problems: MHA Speaker came to talk about his personal journey with substance abuse and addiction. The pt processed ways by which to relate to the speaker. MHA speaker provided handouts and educational information pertaining to groups and services offered by the [facility].   Cynthia Hoffman N Smart LCSW 08/31/2016, 2:21 PM
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
## 2016-08-31 NOTE — Progress Notes (Addendum)
CSW spoke with Minna AntisLorna in admissions. She will be coming today to assess patient (around lunchtime). CSW verified insurance number with [employer] and spoke with pt to get her father's DOB per request of admissions dept. CSW faxed information over to facility.  Trula SladeHeather Smart, MSW, LCSW Clinical Social Worker 08/31/2016 10:04 AM   Per Minna AntisLorna, pt [facility]. Driver will pick up patient between 11am-12pm for transport to facility. MD notified. Pt agreeable to treatment plan and encouraged to contact her support network.   Trula SladeHeather Smart, MSW, LCSW Clinical Social Worker 08/31/2016 1:48 PM

## 2016-08-31 NOTE — Tx Team (Signed)
Interdisciplinary Treatment and Diagnostic Plan Update  08/31/2016 Time of Session: 9:30AM Cynthia Hoffman MRN: [id number]  Principal Diagnosis: Major depression, recurrent (HCC)  Secondary Diagnoses: Principal Problem:   Major depression, recurrent (HCC) Active Problems:   Alcohol dependence (HCC)   Current Medications:  Current Facility-Administered Medications  Medication Dose Route Frequency Provider Last Rate Last Dose  . acetaminophen (TYLENOL) tablet 650 mg  650 mg Oral Q6H PRN Jackelyn PolingJason A Berry, NP      . alum & mag hydroxide-simeth (MAALOX/MYLANTA) 200-200-20 MG/5ML suspension 30 mL  30 mL Oral Q4H PRN Jackelyn PolingJason A Berry, NP      . magnesium hydroxide (MILK OF MAGNESIA) suspension 30 mL  30 mL Oral Daily PRN Jackelyn PolingJason A Berry, NP      . mirtazapine (REMERON) tablet 15 mg  15 mg Oral QHS Oneta Rackanika N Lewis, NP   15 mg at 08/30/16 2148  . triamcinolone cream (KENALOG) 0.5 % 1 application  1 application Topical BID Jackelyn PolingJason A Berry, NP   1 application at 08/30/16 1717   PTA Medications: Prescriptions Prior to Admission  Medication Sig Dispense Refill Last Dose  . triamcinolone cream (KENALOG) 0.5 % Apply 1 application topically 2 (two) times daily.   Past Month at Unknown time    Patient Stressors: Substance abuse Traumatic event  Patient Strengths: Ability for insight Average or above average intelligence Capable of independent living Communication skills Physical Health  Treatment Modalities: Medication Management, Group therapy, Case management,  1 to 1 session with clinician, Psychoeducation, Recreational therapy.   Physician Treatment Plan for Primary Diagnosis: Major depression, recurrent (HCC) Long Term Goal(s): Improvement in symptoms so as ready for discharge Improvement in symptoms so as ready for discharge   Short Term Goals: Ability to verbalize feelings will improve Ability to disclose and discuss suicidal ideas Ability to demonstrate self-control will improve Ability to  identify changes in lifestyle to reduce recurrence of condition will improve Ability to identify and develop effective coping behaviors will improve Compliance with prescribed medications will improve Ability to identify triggers associated with substance abuse/mental health issues will improve  Medication Management: Evaluate patient's response, side effects, and tolerance of medication regimen.  Therapeutic Interventions: 1 to 1 sessions, Unit Group sessions and Medication administration.  Evaluation of Outcomes: Adequate for discharge   Physician Treatment Plan for Secondary Diagnosis: Principal Problem:   Major depression, recurrent (HCC) Active Problems:   Alcohol dependence (HCC)  Long Term Goal(s): Improvement in symptoms so as ready for discharge Improvement in symptoms so as ready for discharge   Short Term Goals: Ability to verbalize feelings will improve Ability to disclose and discuss suicidal ideas Ability to demonstrate self-control will improve Ability to identify changes in lifestyle to reduce recurrence of condition will improve Ability to identify and develop effective coping behaviors will improve Compliance with prescribed medications will improve Ability to identify triggers associated with substance abuse/mental health issues will improve     Medication Management: Evaluate patient's response, side effects, and tolerance of medication regimen.  Therapeutic Interventions: 1 to 1 sessions, Unit Group sessions and Medication administration.  Evaluation of Outcomes: Adequate for discharge    RN Treatment Plan for Primary Diagnosis: Major depression, recurrent (HCC) Long Term Goal(s): Knowledge of disease and therapeutic regimen to maintain health will improve  Short Term Goals: Ability to remain free from injury will improve, Ability to disclose and discuss suicidal ideas and Ability to identify and develop effective coping behaviors will improve  Medication  Management: RN will administer  medications as ordered by provider, will assess and evaluate patient's response and provide education to patient for prescribed medication. RN will report any adverse and/or side effects to prescribing provider.  Therapeutic Interventions: 1 on 1 counseling sessions, Psychoeducation, Medication administration, Evaluate responses to treatment, Monitor vital signs and CBGs as ordered, Perform/monitor CIWA, COWS, AIMS and Fall Risk screenings as ordered, Perform wound care treatments as ordered.  Evaluation of Outcomes: Adequate for discharge    LCSW Treatment Plan for Primary Diagnosis: Major depression, recurrent (HCC) Long Term Goal(s): Safe transition to appropriate next level of care at discharge, Engage patient in therapeutic group addressing interpersonal concerns.  Short Term Goals: Engage patient in aftercare planning with referrals and resources, Facilitate patient progression through stages of change regarding substance use diagnoses and concerns and Identify triggers associated with mental health/substance abuse issues  Therapeutic Interventions: Assess for all discharge needs, 1 to 1 time with Social worker, Explore available resources and support systems, Assess for adequacy in community support network, Educate family and significant other(s) on suicide prevention, Complete Psychosocial Assessment, Interpersonal group therapy.  Evaluation of Outcomes: Adequate for discharge [facility]    Progress in Treatment: Attending groups: Yes. Participating in groups: Yes. Taking medication as prescribed: Yes. Toleration medication: Yes. Family/Significant other contact made: Pt declined to consent to family contact. SPE completed with pt.  Patient understands diagnosis: Yes. Discussing patient identified problems/goals with staff: Yes. Medical problems stabilized or resolved: Yes. Denies suicidal/homicidal ideation: Yes Issues/concerns  per patient self-inventory: No. Other: n/a  New problem(s) identified: Pt continues to demonstrate ambivalence and mood lability at times.   New Short Term/Long Term Goal(s): medication stabilization; aftercare plan; mood stabilization.   Discharge Plan or Barriers: [facility] for Wed 11[facility]8.   Reason for Continuation of Hospitalization: Medication management   Estimated Length of Stay: d/c Wed at 11:00AM  Attendees: Patient: 08/31/2016 1:56 PM  Physician: Dr. Vallery RidgeElizabeth Oates MD 08/31/2016 1:56 PM  Nursing: Ardelle ParkBeverly, Kim RN 08/31/2016 1:56 PM  RN Care Manager:  08/31/2016 1:56 PM  Social Worker: Trula SladeHeather Smart, LCSW 08/31/2016 1:56 PM  Recreational Therapist:  08/31/2016 1:56 PM  Other: Claudette Headonrad Withrow; May Augustin NP 08/31/2016 1:56 PM  Other:  08/31/2016 1:56 PM  Other: 08/31/2016 1:56 PM    Scribe for Treatment Team: Ledell PeoplesHeather N Smart, LCSW 08/31/2016 1:56 PM

## 2016-09-01 NOTE — Progress Notes (Signed)
Recreation Therapy Notes  Date:  09/01/16 Time: 0930 Location: [address] Dayroom  Group Topic: Stress Management  Goal Area(s) Addresses:  Patient will verbalize importance of using healthy stress management.  Patient will identify positive emotions associated with healthy stress management.   Intervention: Calm App  Activity :  Forgiveness of Self Imagery.  LRT introduced the concept of guided imagery.  LRT played an imagery meditation from the Calm app so patients could engage and participate in the activity.  Patients were to follow along with the recording to participate in the activity.    Education:  Stress Management, Discharge Planning.   Education Outcome: Acknowledges edcuation/In group clarification offered/Needs additional education  Clinical Observations/Feedback: Pt did not attend group.     [name], LRT/CTRS         Cynthia Hoffman 09/01/2016 11:54 AM 

## 2016-09-01 NOTE — Progress Notes (Signed)
Written/verbal discharge instructions, AVS, SRA, Transition Report, prescriptions and follow-up appointments given to patient with verbalization of understanding;  Patient denies suicidal and homicidal ideation. Suicide Prevention information/materials given to patient  All patient belongings returned to patient at time of discharge (she had no belongings in locker).  Discharged home in stable condition.

## 2016-09-01 NOTE — Progress Notes (Signed)
D:  Patient awake and alert; oriented x 4; she denies suicidal and homicidal ideation and AVH; no self-injurous behaviors noted or reported. A: Patient verbally contracts for safety;   Emotional support provided; encouraged her to seek assistance with needs/concerns. R:  Safety maintained on unit.

## 2016-09-01 NOTE — Progress Notes (Addendum)
  [facility] Adult Case Management Discharge Plan :  Will you be returning to the same living situation after discharge:  Yes,  home with sister At discharge, do you have transportation home?: Yes,  sister coming at 3pm Do you have the ability to pay for your medications: Yes,  BCBS private insurance  Release of information consent forms completed and submitted to medical records by CSW.  Patient to Follow up at: Follow-up Information    [facility] Follow up on 09/01/2016.   Why:  Patient has been accepted for treatment at this facilty for Wednesday, September 01, 2016. However, patient declined admission. Please contact Lorna at [phone] if you change your mind.  Contact information: [address] [place], [place] [address] Phone: [phone] Fax: [phone]       [facility] Follow up.   Why:  You have been referred to this program for assessment. Donia GuilesJenny Edminson will contact you to schedule. Thank you.  Contact information: [address] [place], [place] [address] Phone: [phone] Fax: [phone]              [facility] Follow up.   Specialty:  Behavioral Health Why:  If you choose not to puruse IOP programs, please contact this office to schedule medication management and counseling. They require deposit and will not schedule until this is paid. Thank you.  Contact information: [address] [place] [address] [phone]           Next level of care provider has access to [facility] Link:no  Safety Planning and Suicide Prevention discussed: Yes,  SPE completed with pt; pt declined to consent to family contact.  Have you used any form of tobacco in the last 30 days? (Cigarettes, Smokeless Tobacco, Cigars, and/or Pipes): No  Has patient been referred to the Quitline?: N/A patient is not a smoker  Patient has been referred for addiction treatment: Yes  Nader Boys N  Smart LCSW 09/01/2016, 10:23 AM

## 2016-09-01 NOTE — Plan of Care (Signed)
Problem: Coping: Goal: Ability to cope will improve Outcome: Progressing Denies suicidal ideation  Problem: Safety: Goal: Ability to remain free from injury will improve Outcome: Progressing Safety maintained on unit

## 2016-09-01 NOTE — Progress Notes (Signed)
Adult Psychoeducational Group Note  Date:  09/01/2016 Time:  1:17 AM  Group Topic/Focus:  Wrap-Up Group:   The focus of this group is to help patients review their daily goal of treatment and discuss progress on daily workbooks.   Participation Level:  Active  Participation Quality:  Appropriate  Affect:  Appropriate  Cognitive:  Alert  Insight: Appropriate  Engagement in Group:  Engaged  Modes of Intervention:  Discussion  Additional Comments:  Patient states, "my day was depressing". Patient states she is trying to find an after care treatment center. On a scale between 1-10, (1=worse, 10=best) patient rated her day a 1. Keyan Folson L Kamyrah Feeser 09/01/2016, 1:17 AM

## 2016-09-01 NOTE — Progress Notes (Signed)
Night nurse reported that patient had called [facility] and had cancelled her admission. Patient verified that she did cancel admission.

## 2016-09-06 ENCOUNTER — Other Ambulatory Visit (HOSPITAL_COMMUNITY): Payer: BLUE CROSS/BLUE SHIELD | Attending: Psychiatry | Admitting: Licensed Clinical Social Worker

## 2016-09-06 ENCOUNTER — Encounter (INDEPENDENT_AMBULATORY_CARE_PROVIDER_SITE_OTHER): Payer: Self-pay

## 2016-09-06 DIAGNOSIS — F332 Major depressive disorder, recurrent severe without psychotic features: Secondary | ICD-10-CM | POA: Diagnosis present

## 2016-09-06 DIAGNOSIS — F4310 Post-traumatic stress disorder, unspecified: Secondary | ICD-10-CM
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
## 2016-09-07 ENCOUNTER — Telehealth (HOSPITAL_COMMUNITY): Payer: Self-pay | Admitting: Licensed Clinical Social Worker

## 2016-09-07 ENCOUNTER — Other Ambulatory Visit (HOSPITAL_COMMUNITY): Payer: BLUE CROSS/BLUE SHIELD

## 2016-09-08 ENCOUNTER — Other Ambulatory Visit (HOSPITAL_COMMUNITY): Payer: BLUE CROSS/BLUE SHIELD | Admitting: Licensed Clinical Social Worker

## 2016-09-08 DIAGNOSIS — F332 Major depressive disorder, recurrent severe without psychotic features: Secondary | ICD-10-CM

## 2016-09-08 DIAGNOSIS — F4310 Post-traumatic stress disorder, unspecified: Secondary | ICD-10-CM

## 2016-09-08 NOTE — Progress Notes (Signed)
Patient left group at approximately 12:30 during a break period without informing cln. Cln called to follow up with pt who reports she had another doctor's appointment at 2 and left to make it to that appointment. Pt states she will be in group tomorrow. Pt was unable to see the psychiatrist today due to leaving unannounced. Pt denies active SI/HI.

## 2016-09-09 ENCOUNTER — Telehealth (HOSPITAL_COMMUNITY): Payer: Self-pay | Admitting: Licensed Clinical Social Worker

## 2016-09-09 ENCOUNTER — Other Ambulatory Visit (HOSPITAL_COMMUNITY): Payer: BLUE CROSS/BLUE SHIELD
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
## 2016-09-10 ENCOUNTER — Other Ambulatory Visit (HOSPITAL_COMMUNITY): Payer: Self-pay

## 2016-09-13 ENCOUNTER — Other Ambulatory Visit (HOSPITAL_COMMUNITY): Payer: Self-pay

## 2016-09-14 ENCOUNTER — Ambulatory Visit (HOSPITAL_COMMUNITY): Payer: Self-pay

## 2016-09-14 ENCOUNTER — Telehealth (HOSPITAL_COMMUNITY): Payer: Self-pay | Admitting: Licensed Clinical Social Worker

## 2016-09-15 ENCOUNTER — Telehealth (HOSPITAL_COMMUNITY): Payer: Self-pay | Admitting: Licensed Clinical Social Worker

## 2016-09-15 ENCOUNTER — Other Ambulatory Visit (HOSPITAL_COMMUNITY): Payer: Self-pay

## 2016-09-20 ENCOUNTER — Other Ambulatory Visit (HOSPITAL_COMMUNITY): Payer: Self-pay

## 2016-09-21 ENCOUNTER — Ambulatory Visit (HOSPITAL_COMMUNITY): Payer: Self-pay

## 2016-09-21 ENCOUNTER — Other Ambulatory Visit (HOSPITAL_COMMUNITY): Payer: Self-pay

## 2016-09-22 ENCOUNTER — Other Ambulatory Visit (HOSPITAL_COMMUNITY): Payer: Self-pay

## 2016-09-22 NOTE — Progress Notes (Signed)
[facility] due to non-compliance with treatment including not attending group and not making contact regarding absences. Psychiatrist was consulted regarding discharge. Clinician called pt to inform her of discharge and was unable to make contact with patient due to both numbers on record not being answered and no voicemail option.

## 2016-09-23 ENCOUNTER — Other Ambulatory Visit (HOSPITAL_COMMUNITY): Payer: Self-pay

## 2016-09-23 ENCOUNTER — Ambulatory Visit (HOSPITAL_COMMUNITY): Payer: Self-pay

## 2016-09-24 ENCOUNTER — Other Ambulatory Visit (HOSPITAL_COMMUNITY): Payer: Managed Care, Other (non HMO) | Attending: Psychiatry | Admitting: Licensed Clinical Social Worker

## 2016-09-24 DIAGNOSIS — F4310 Post-traumatic stress disorder, unspecified: Secondary | ICD-10-CM

## 2016-09-24 DIAGNOSIS — F332 Major depressive disorder, recurrent severe without psychotic features: Secondary | ICD-10-CM | POA: Insufficient documentation

## 2016-09-27 ENCOUNTER — Other Ambulatory Visit (HOSPITAL_COMMUNITY): Payer: Self-pay

## 2016-09-27 ENCOUNTER — Telehealth (HOSPITAL_COMMUNITY): Payer: Self-pay | Admitting: Licensed Clinical Social Worker
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
## 2016-09-28 ENCOUNTER — Other Ambulatory Visit (HOSPITAL_COMMUNITY): Payer: Self-pay

## 2016-09-28 ENCOUNTER — Ambulatory Visit (HOSPITAL_COMMUNITY): Payer: Self-pay

## 2016-09-29 ENCOUNTER — Other Ambulatory Visit (HOSPITAL_COMMUNITY): Payer: Self-pay

## 2016-09-29 ENCOUNTER — Telehealth (HOSPITAL_COMMUNITY): Payer: Self-pay | Admitting: Licensed Clinical Social Worker

## 2016-09-30 ENCOUNTER — Ambulatory Visit (HOSPITAL_COMMUNITY): Payer: Self-pay

## 2016-09-30 ENCOUNTER — Other Ambulatory Visit (HOSPITAL_COMMUNITY): Payer: Managed Care, Other (non HMO) | Admitting: Specialist

## 2016-09-30 ENCOUNTER — Other Ambulatory Visit (HOSPITAL_COMMUNITY): Payer: Self-pay

## 2016-09-30 ENCOUNTER — Other Ambulatory Visit (HOSPITAL_COMMUNITY): Payer: Managed Care, Other (non HMO) | Admitting: Licensed Clinical Social Worker

## 2016-09-30 DIAGNOSIS — F4310 Post-traumatic stress disorder, unspecified: Secondary | ICD-10-CM

## 2016-09-30 DIAGNOSIS — F332 Major depressive disorder, recurrent severe without psychotic features: Secondary | ICD-10-CM

## 2016-09-30 MED ORDER — GABAPENTIN 300 MG PO CAPS: 300.0000 mg | ORAL_CAPSULE | Freq: Two times a day (BID) | ORAL | 0 refills | Status: DC

## 2016-09-30 MED ORDER — MIRTAZAPINE 30 MG PO TABS: 30.0000 mg | ORAL_TABLET | Freq: Every day | ORAL | 2 refills | Status: DC

## 2016-09-30 MED ORDER — GABAPENTIN 300 MG PO CAPS: 300.0000 mg | ORAL_CAPSULE | Freq: Three times a day (TID) | ORAL | 2 refills | Status: DC
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
## 2016-10-01 ENCOUNTER — Other Ambulatory Visit (HOSPITAL_COMMUNITY): Payer: Self-pay
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
## 2016-10-01 NOTE — Psych (Signed)
   [facility]  Cynthia HuskyJillian Hoffman [id number]  Session Time: 9 AM - 2 PM  Participation Level: Active  Behavioral Response: CasualAlertDepressed  Type of Therapy: Group Therapy  Treatment Goals addressed: Coping, Depression  Interventions: CBT, DBT, Supportive and Reframing  Summary: Clinician facilitated check-in regarding current stressors and situation, and review of patient completed diary card. Clinician utilized active listening and empathetic response and validated patient emotions. Clinician facilitated discussion on time management, relationships, and anhedonia. Clinician introduced topic of decision making and group worked on ways to address impulsiveness including checking the facts, best friend test, and accessing wise mind.  Clinician assessed for immediate needs, medication compliance and efficacy, and safety concerns.    Suicidal/Homicidal: Nowithout intent/plan  Therapist Response: Cynthia HuskyJillian Hoffman is a 20 y.o. female who presents with trauma and depression symptoms. Patient arrived within time allowed and reports she is feeling "good today." Patient rates her mood at a 5 on a 1- 10 scale with 10 being great.  Patient engaged in group activity and discussion. Patient identified her barrier to effective decision making as acting impulsively. Patient demonstrates some progress as evidenced by coming to group and participating. Patient denies SI/HI.   Plan: Patient will continue in PHP and medication management. Work towards decreasing depression symptoms and increase emotional regulation and positive coping skills.    Diagnosis: PTSD (post-traumatic stress disorder) [F43.10]    1. PTSD (post-traumatic stress disorder)       Donia GuilesJenny Tarick Parenteau, LCSW 10/01/2016

## 2016-10-04 ENCOUNTER — Other Ambulatory Visit (HOSPITAL_COMMUNITY): Payer: Self-pay

## 2016-10-05 ENCOUNTER — Emergency Department (HOSPITAL_COMMUNITY)
Admission: EM | Admit: 2016-10-05 | Discharge: 2016-10-05 | Disposition: A | Discharge status: Home / Self Care | Payer: Managed Care, Other (non HMO) | Attending: Emergency Medicine | Admitting: Emergency Medicine

## 2016-10-05 ENCOUNTER — Other Ambulatory Visit (HOSPITAL_COMMUNITY): Payer: Self-pay

## 2016-10-05 ENCOUNTER — Ambulatory Visit (HOSPITAL_COMMUNITY): Payer: Self-pay

## 2016-10-05 ENCOUNTER — Encounter (HOSPITAL_COMMUNITY): Payer: Self-pay | Admitting: Emergency Medicine

## 2016-10-05 DIAGNOSIS — F909 Attention-deficit hyperactivity disorder, unspecified type: Secondary | ICD-10-CM | POA: Diagnosis not present

## 2016-10-05 DIAGNOSIS — L02415 Cutaneous abscess of right lower limb: Secondary | ICD-10-CM | POA: Insufficient documentation

## 2016-10-05 DIAGNOSIS — L0291 Cutaneous abscess, unspecified: Secondary | ICD-10-CM

## 2016-10-05 MED ORDER — IBUPROFEN 800 MG PO TABS: 800.0000 mg | ORAL_TABLET | Freq: Three times a day (TID) | ORAL | 0 refills | Status: DC | PRN

## 2016-10-05 MED ORDER — HYDROCODONE-ACETAMINOPHEN 5-325 MG PO TABS: 1.0000 | ORAL_TABLET | ORAL | 0 refills | Status: DC | PRN

## 2016-10-05 MED ORDER — LIDOCAINE HCL 1 % IJ SOLN
INTRAMUSCULAR | Status: AC
  Administered 2016-10-05: 17:00:00
  Filled 2016-10-05: qty 20

## 2016-10-05 MED ORDER — LIDOCAINE HCL (PF) 1 % IJ SOLN: 5.0000 mL | Freq: Once | INTRAMUSCULAR | Status: DC

## 2016-10-05 MED ORDER — HYDROMORPHONE HCL 2 MG/ML IJ SOLN
2.0000 mg | Freq: Once | INTRAMUSCULAR | Status: AC
  Administered 2016-10-05: 2 mg via INTRAMUSCULAR
  Filled 2016-10-05: qty 1

## 2016-10-05 NOTE — ED Provider Notes (Signed)
[facility]-EMERGENCY DEPT Provider Note   CSN: [id number] Arrival date & time: 10/05/16  1514  By signing my name below, I, [name], attest that this documentation has been prepared under the direction and in the presence of Trixie DredgeEmily Myiah Petkus, PA-C Electronically Signed: [name], ED Scribe. 10/05/16. 3:53 PM.  History   Chief Complaint Chief Complaint  Patient presents with  . Abscess    HPI Cynthia Hoffman is a 20 y.o. female who presents to the Emergency Department complaining of abscess to right upper thigh onset 3 weeks ago. Pt notes that she has had abscesses in the past to her axillas and inner thighs that were alleviated with warm compresses. Pt is having associated symptoms of body aches. She notes that she has tried warm compresses/soaks, heating pad, and dapsone for the relief of her symptoms. Pt states that the dapsone is prescribed by her dermatologist. She denies fever, chills, drainage, vaginal discharge, urinary symptoms, and any other symptoms.   The history is provided by the patient. No language interpreter was used.    Past Medical History:  Diagnosis Date  . ADHD   . Anxiety   . Depression     Patient Active Problem List   Diagnosis Date Noted  . Major depression, recurrent (HCC) 08/27/2016  . Alcohol dependence (HCC) 08/27/2016    Past Surgical History:  Procedure Laterality Date  . INDUCED ABORTION N/A 08/21/2016   [facility] [place] [place]    OB History    Gravida Para Term Preterm AB Living   1             SAB TAB Ectopic Multiple Live Births                   Home Medications    Prior to Admission medications   Medication Sig Start Date End Date Taking? Authorizing Provider  gabapentin (NEURONTIN) 300 MG capsule Take 1 capsule (300 mg total) by mouth 2 (two) times daily. 09/30/16 09/30/17  Benjaman PottGerald D Taylor, MD  HYDROcodone-acetaminophen (NORCO/VICODIN) 5-325 MG tablet Take 1 tablet by mouth every 4 (four) hours as needed  for moderate pain or severe pain. 10/05/16   Trixie DredgeEmily Calia Napp, PA-C  ibuprofen (ADVIL,MOTRIN) 800 MG tablet Take 1 tablet (800 mg total) by mouth every 8 (eight) hours as needed for mild pain or moderate pain. 10/05/16   Trixie DredgeEmily Kniyah Khun, PA-C  mirtazapine (REMERON) 30 MG tablet Take 1 tablet (30 mg total) by mouth at bedtime. 09/30/16 09/30/17  Benjaman PottGerald D Taylor, MD    Family History No family history on file.  Social History Social History  Substance Use Topics  . Smoking status: Never Smoker  . Smokeless tobacco: Never Used  . Alcohol use Yes     Allergies   Bactrim [sulfamethoxazole-trimethoprim]   Review of Systems Review of Systems  Constitutional: Negative for chills and fever.  Gastrointestinal: Negative for abdominal pain, blood in stool, constipation and diarrhea.  Genitourinary: Negative for difficulty urinating, dysuria, frequency, urgency and vaginal discharge.  Musculoskeletal: Positive for myalgias.  Skin: Negative for color change.       +Abscess to right inner upper thigh without drainage  Allergic/Immunologic: Negative for immunocompromised state.  Psychiatric/Behavioral: Negative for self-injury.     Physical Exam Updated Vital Signs BP 151/81 (BP Location: Left Arm)   Pulse 96   Temp 98.3 F (36.8 C) (Oral)   Resp 18   LMP  (LMP Unknown)   SpO2 100%   Physical Exam  Constitutional: She appears well-developed and well-nourished. No distress.  HENT:  Head: Normocephalic and atraumatic.  Neck: Neck supple.  Pulmonary/Chest: Effort normal.  Neurological: She is alert.  Skin: She is not diaphoretic.  Right upper medial thigh with oval-shaped area of fluctuance without overlying erythema or warmth. TTP. No active drainage.   Nursing note and vitals reviewed.    ED Treatments / Results  DIAGNOSTIC STUDIES: Oxygen Saturation is 100% on RA, nl by my interpretation.    COORDINATION OF CARE: 3:51 PM Discussed treatment plan with pt at bedside which includes  dilaudid injection and I&D and pt agreed to plan.   Procedures .Marland Kitchen.Incision and Drainage Date/Time: 10/05/2016 4:58 PM Performed by: Trixie DredgeWEST, Latiya Navia Authorized by: Trixie DredgeWEST, Okla Qazi   Consent:    Consent obtained:  Verbal   Consent given by:  Patient   Risks discussed:  Pain and infection   Alternatives discussed:  No treatment Location:    Type:  Abscess   Location: right upper inner thigh. Pre-procedure details:    Skin preparation:  Betadine Anesthesia (see MAR for exact dosages):    Anesthesia method:  Local infiltration   Local anesthetic:  Lidocaine 1% w/o epi (8 ml used) Procedure type:    Complexity:  Simple Procedure details:    Needle aspiration: no     Incision types:  Single straight   Incision depth:  Dermal   Scalpel blade:  11   Wound management:  Probed and deloculated and irrigated with saline   Drainage:  Bloody and purulent   Drainage amount:  Copious   Wound treatment:  Wound left open   Packing materials:  None Post-procedure details:    Patient tolerance of procedure:  Tolerated well, no immediate complications      (including critical care time)  Medications Ordered in ED Medications  lidocaine (PF) (XYLOCAINE) 1 % injection 5 mL ( Infiltration Canceled Entry 10/05/16 1644)  HYDROmorphone (DILAUDID) injection 2 mg (2 mg Intramuscular Given 10/05/16 1602)  lidocaine (XYLOCAINE) 1 % (with pres) injection (  Given by Other 10/05/16 1644)     Initial Impression / Assessment and Plan / ED Course  I have reviewed the triage vital signs and the nursing notes.   Clinical Course     Afebrile, nontoxic patient with abscess of right medial thigh.  I&D in ED with successful drainage.  No overlying erythema.   D/C home with pain medication, home care instructions, return precautions.  Discussed result, findings, treatment, and follow up  with patient.  Pt given return precautions.  Pt verbalizes understanding and agrees with plan.       Final Clinical  Impressions(s) / ED Diagnoses   Final diagnoses:  Abscess    New Prescriptions Discharge Medication List as of 10/05/2016  5:13 PM    START taking these medications   Details  HYDROcodone-acetaminophen (NORCO/VICODIN) 5-325 MG tablet Take 1 tablet by mouth every 4 (four) hours as needed for moderate pain or severe pain., Starting Tue 10/05/2016, Print    ibuprofen (ADVIL,MOTRIN) 800 MG tablet Take 1 tablet (800 mg total) by mouth every 8 (eight) hours as needed for mild pain or moderate pain., Starting Tue 10/05/2016, Print       I personally performed the services described in this documentation, which was scribed in my presence. The recorded information has been reviewed and is accurate.     Trixie Dredgemily Alanee Ting, PA-C 10/05/16 1950    Rolan BuccoMelanie Belfi, MD 10/05/16 [phone]

## 2016-10-05 NOTE — ED Notes (Signed)
Patient was alert, oriented and stable upon discharge. RN went over AVS and patient had no further questions.  

## 2016-10-05 NOTE — ED Triage Notes (Signed)
Patient c/o right upper thigh abscess that has been there for couple days. Patient denies any drainage just very painful esp when she walks.  Patient has PMH abscesses.

## 2016-10-05 NOTE — Discharge Instructions (Signed)
Read the information below.  Use the prescribed medication as directed.  Please discuss all new medications with your pharmacist.  You may return to the Emergency Department at any time for worsening condition or any new symptoms that concern you.    If you develop redness, swelling, worsening pain, or fevers greater than 100.4, return to the ER immediately for a recheck.   °

## 2016-10-06 ENCOUNTER — Other Ambulatory Visit (HOSPITAL_COMMUNITY): Payer: Self-pay

## 2016-10-07 ENCOUNTER — Telehealth (HOSPITAL_COMMUNITY): Payer: Self-pay | Admitting: Licensed Clinical Social Worker

## 2016-10-07 ENCOUNTER — Other Ambulatory Visit (HOSPITAL_COMMUNITY): Payer: Self-pay | Admitting: Psychiatry

## 2016-10-07 ENCOUNTER — Ambulatory Visit (HOSPITAL_COMMUNITY): Payer: Self-pay

## 2016-10-07 NOTE — Progress Notes (Signed)
Partial Hospital Discharge note Date of readmission:  09/24/2016 Date of discharge: 10/07/2016  PHP Course:  Once again non attendance was the issue.  She came once or twice and did not return or return phone calls as she had done on past attempts to engage her.  The recommendation was that she would likely be better off at home in [place] where she has the support of her family but she did not inform us if that was where she went.  Consequently she was discharged once again.  Diagnosis remains PTSD Follow up will have to be arranged by her but if she contacts us we will help her find outpatient treatment

## 2016-10-08 ENCOUNTER — Other Ambulatory Visit (HOSPITAL_COMMUNITY): Payer: Self-pay

## 2016-10-11 ENCOUNTER — Other Ambulatory Visit (HOSPITAL_COMMUNITY): Payer: Self-pay

## 2016-10-12 ENCOUNTER — Other Ambulatory Visit (HOSPITAL_COMMUNITY): Payer: Self-pay

## 2016-10-12 ENCOUNTER — Ambulatory Visit (HOSPITAL_COMMUNITY): Payer: Self-pay

## 2016-10-13 ENCOUNTER — Other Ambulatory Visit (HOSPITAL_COMMUNITY): Payer: Self-pay

## 2016-10-14 ENCOUNTER — Other Ambulatory Visit (HOSPITAL_COMMUNITY): Payer: Self-pay

## 2016-10-14 ENCOUNTER — Ambulatory Visit (HOSPITAL_COMMUNITY): Payer: Self-pay

## 2016-10-15 ENCOUNTER — Other Ambulatory Visit (HOSPITAL_COMMUNITY): Payer: Self-pay

## 2016-10-19 ENCOUNTER — Ambulatory Visit (HOSPITAL_COMMUNITY): Payer: Self-pay

## 2016-10-20 ENCOUNTER — Other Ambulatory Visit (HOSPITAL_COMMUNITY): Payer: Self-pay

## 2016-10-21 ENCOUNTER — Other Ambulatory Visit (HOSPITAL_COMMUNITY): Payer: Self-pay

## 2016-10-21 ENCOUNTER — Ambulatory Visit (HOSPITAL_COMMUNITY): Payer: Self-pay

## 2016-10-22 ENCOUNTER — Other Ambulatory Visit (HOSPITAL_COMMUNITY): Payer: Self-pay

## 2016-10-24 ENCOUNTER — Emergency Department (HOSPITAL_COMMUNITY)
Admission: EM | Admit: 2016-10-24 | Discharge: 2016-10-24 | Disposition: A | Discharge status: Home / Self Care | Payer: BLUE CROSS/BLUE SHIELD

## 2016-10-26 ENCOUNTER — Ambulatory Visit (HOSPITAL_COMMUNITY): Payer: Self-pay

## 2016-10-27 ENCOUNTER — Other Ambulatory Visit (HOSPITAL_COMMUNITY): Payer: Self-pay

## 2016-10-28 ENCOUNTER — Other Ambulatory Visit (HOSPITAL_COMMUNITY): Payer: Self-pay

## 2016-10-28 ENCOUNTER — Ambulatory Visit (HOSPITAL_COMMUNITY): Payer: Self-pay

## 2016-10-29 ENCOUNTER — Other Ambulatory Visit (HOSPITAL_COMMUNITY): Payer: Self-pay

## 2016-11-01 ENCOUNTER — Other Ambulatory Visit (HOSPITAL_COMMUNITY): Payer: Self-pay

## 2016-11-15 ENCOUNTER — Ambulatory Visit (HOSPITAL_COMMUNITY): Payer: Self-pay | Admitting: Psychiatry

## 2016-11-25 NOTE — Progress Notes (Deleted)
Psychiatric Initial Adult Assessment   Patient Identification: Cynthia Hoffman MRN:  [id number] Date of Evaluation:  11/25/2016 Referral Source: *** Chief Complaint:   Visit Diagnosis: No diagnosis found.  History of Present Illness:   Cynthia Hoffman is a 21 year old female with alcohol use disorder, marijuana use disorder, depression, who  Patient had admission to [facility] in 08/2016 for worsening depression,   Associated Signs/Symptoms: Depression Symptoms:  {DEPRESSION SYMPTOMS:20000} (Hypo) Manic Symptoms:  {BHH MANIC SYMPTOMS:22872} Anxiety Symptoms:  {BHH ANXIETY SYMPTOMS:22873} Psychotic Symptoms:  {BHH PSYCHOTIC SYMPTOMS:22874} PTSD Symptoms: {BHH PTSD SYMPTOMS:22875}  Past Psychiatric History: ***  Previous Psychotropic Medications: {YES/NO:21197}  Substance Abuse History in the last 12 months:  {yes no:314532}  Consequences of Substance Abuse: {BHH CONSEQUENCES OF SUBSTANCE ABUSE:22880}  Past Medical History:  Past Medical History:  Diagnosis Date  . ADHD   . Anxiety   . Depression     Past Surgical History:  Procedure Laterality Date  . INDUCED ABORTION N/A 08/21/2016   [facility] [place] [place]    Family Psychiatric History: ***  Family History: No family history on file.  Social History:   Social History   Social History  . Marital status: Single    Spouse name: N/A  . Number of children: N/A  . Years of education: N/A   Social History Main Topics  . Smoking status: Never Smoker  . Smokeless tobacco: Never Used  . Alcohol use Yes  . Drug use: Yes    Types: Marijuana, Oxycodone  . Sexual activity: Yes    Birth control/ protection: None   Other Topics Concern  . Not on file   Social History Narrative  . 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 Patient is from [place] and that's where her family lives.  Allergies:   Allergies  Allergen Reactions  . Bactrim [Sulfamethoxazole-Trimethoprim] Nausea Only    Metabolic Disorder Labs: No results found for: HGBA1C, MPG No results found for: PROLACTIN No results found for: CHOL, TRIG, HDL, CHOLHDL, VLDL, LDLCALC   Current Medications: Current Outpatient Prescriptions  Medication Sig Dispense Refill  . gabapentin (NEURONTIN) 300 MG capsule Take 1 capsule (300 mg total) by mouth 2 (two) times daily. 60 capsule 0  . HYDROcodone-acetaminophen (NORCO/VICODIN) 5-325 MG tablet Take 1 tablet by mouth every 4 (four) hours as needed for moderate pain or severe pain. 6 tablet 0  . ibuprofen (ADVIL,MOTRIN) 800 MG tablet Take 1 tablet (800 mg total) by mouth every 8 (eight) hours as needed for mild pain or moderate pain. 15 tablet 0  . mirtazapine (REMERON) 30 MG tablet Take 1 tablet (30 mg total) by mouth at bedtime. 30 tablet 2   No current facility-administered medications for this visit.     Neurologic: Headache: No Seizure: No Paresthesias:No  Musculoskeletal: Strength & Muscle Tone: within normal limits Gait & Station: normal Patient leans: N/A  Psychiatric Specialty Exam: ROS  unknown if currently breastfeeding.There is no height or weight on file to calculate BMI.  General Appearance: Fairly Groomed  Eye Contact:  Good  Speech:  Clear and Coherent  Volume:  Normal  Mood:  {BHH MOOD:22306}  Affect:  {Affect (PAA):22687}  Thought Process:  Coherent and Goal Directed  Orientation:  Full (Time, Place, and  Person)  Thought Content:  Logical  Suicidal Thoughts:  {ST/HT (PAA):22692}  Homicidal Thoughts:  {ST/HT (PAA):22692}  Memory:  Immediate;   Good Recent;   Good Remote;   Good  Judgement:  {Judgement (PAA):22694}  Insight:  {Insight (PAA):22695}  Psychomotor Activity:  Normal  Concentration:  Concentration: Good and Attention Span: Good   Recall:  Good  Fund of Knowledge:Good  Language: Good  Akathisia:  No  Handed:  Right  AIMS (if indicated):  N/A  Assets:  Communication Skills Desire for Improvement  ADL's:  Intact  Cognition: WNL  Sleep:  ***   Assessment  Plan  The patient demonstrates the following risk factors for suicide: Chronic risk factors for suicide include: {Chronic Risk Factors for JYNWGNF:62130865}Suicide:30414011}. Acute risk factors for suicide include: {Acute Risk Factors for HQIONGE:95284132}Suicide:30414012}. Protective factors for this patient include: {Protective Factors for Suicide GMWN:02725366}Risk:30414013}. Considering these factors, the overall suicide risk at this point appears to be {Desc; low/moderate/high:110033}. Patient {ACTION; IS/IS YQI:34742595}OT:21021397} appropriate for outpatient follow up.   Treatment Plan Summary: {[facility] [place] GLOV:5643329518}Plan:[phone]}   Neysa Hottereina Louise Rawson, MD 2/1/20181:06 PM

## 2016-12-06 ENCOUNTER — Ambulatory Visit (HOSPITAL_COMMUNITY): Payer: Self-pay | Admitting: Psychiatry

## 2016-12-20 ENCOUNTER — Encounter (HOSPITAL_COMMUNITY): Payer: Self-pay

## 2016-12-20 ENCOUNTER — Emergency Department (HOSPITAL_COMMUNITY)
Admission: EM | Admit: 2016-12-20 | Discharge: 2016-12-20 | Disposition: A | Discharge status: Home / Self Care | Payer: BLUE CROSS/BLUE SHIELD | Attending: Emergency Medicine | Admitting: Emergency Medicine

## 2016-12-20 DIAGNOSIS — Z3A00 Weeks of gestation of pregnancy not specified: Secondary | ICD-10-CM | POA: Insufficient documentation

## 2016-12-20 DIAGNOSIS — O210 Mild hyperemesis gravidarum: Secondary | ICD-10-CM | POA: Insufficient documentation

## 2016-12-20 DIAGNOSIS — O0281 Inappropriate change in quantitative human chorionic gonadotropin (hCG) in early pregnancy: Secondary | ICD-10-CM | POA: Insufficient documentation

## 2016-12-20 DIAGNOSIS — F909 Attention-deficit hyperactivity disorder, unspecified type: Secondary | ICD-10-CM | POA: Insufficient documentation

## 2016-12-20 DIAGNOSIS — R197 Diarrhea, unspecified: Secondary | ICD-10-CM | POA: Insufficient documentation

## 2016-12-20 DIAGNOSIS — R112 Nausea with vomiting, unspecified: Secondary | ICD-10-CM

## 2016-12-20 DIAGNOSIS — Z3491 Encounter for supervision of normal pregnancy, unspecified, first trimester: Secondary | ICD-10-CM

## 2016-12-20 LAB — HCG, QUANTITATIVE, PREGNANCY: hCG, Beta Chain, Quant, S: 36487 m[IU]/mL — ABNORMAL HIGH (ref <5)

## 2016-12-20 LAB — CBC
HCT: 40.1 % (ref 36.0–46.0)
Hemoglobin: 13.4 g/dL (ref 12.0–15.0)
MCH: 26.1 pg (ref 26.0–34.0)
MCHC: 33.4 g/dL (ref 30.0–36.0)
MCV: 78 fL (ref 78.0–100.0)
PLATELETS: 345 10*3/uL (ref 150–400)
RBC: 5.14 MIL/uL — ABNORMAL HIGH (ref 3.87–5.11)
RDW: 13.3 % (ref 11.5–15.5)
WBC: 9.1 10*3/uL (ref 4.0–10.5)

## 2016-12-20 LAB — URINALYSIS, ROUTINE W REFLEX MICROSCOPIC
Bacteria, UA: NONE SEEN
Bilirubin Urine: NEGATIVE
GLUCOSE, UA: NEGATIVE mg/dL
Ketones, ur: NEGATIVE mg/dL
Leukocytes, UA: NEGATIVE
Nitrite: NEGATIVE
PROTEIN: 30 mg/dL — AB
Specific Gravity, Urine: 1.028 (ref 1.005–1.030)
pH: 6 (ref 5.0–8.0)

## 2016-12-20 LAB — DIFFERENTIAL
BASOS ABS: 0 10*3/uL (ref 0.0–0.1)
BASOS PCT: 0 %
EOS ABS: 0 10*3/uL (ref 0.0–0.7)
Eosinophils Relative: 0 %
LYMPHS ABS: 0.7 10*3/uL (ref 0.7–4.0)
Lymphocytes Relative: 7 %
Monocytes Absolute: 0.2 10*3/uL (ref 0.1–1.0)
Monocytes Relative: 2 %
NEUTROS PCT: 91 %
Neutro Abs: 8.2 10*3/uL — ABNORMAL HIGH (ref 1.7–7.7)

## 2016-12-20 LAB — COMPREHENSIVE METABOLIC PANEL
ALBUMIN: 4.2 g/dL (ref 3.5–5.0)
ALK PHOS: 66 U/L (ref 38–126)
ALT: 17 U/L (ref 14–54)
AST: 29 U/L (ref 15–41)
Anion gap: 6 (ref 5–15)
BUN: 7 mg/dL (ref 6–20)
CALCIUM: 9.3 mg/dL (ref 8.9–10.3)
CO2: 24 mmol/L (ref 22–32)
CREATININE: 0.63 mg/dL (ref 0.44–1.00)
Chloride: 106 mmol/L (ref 101–111)
GFR calc Af Amer: >60 mL/min (ref >60)
GFR calc non Af Amer: >60 mL/min (ref >60)
GLUCOSE: 103 mg/dL — AB (ref 65–99)
Potassium: 4.2 mmol/L (ref 3.5–5.1)
SODIUM: 136 mmol/L (ref 135–145)
Total Bilirubin: 0.9 mg/dL (ref 0.3–1.2)
Total Protein: 8.7 g/dL — ABNORMAL HIGH (ref 6.5–8.1)

## 2016-12-20 LAB — I-STAT BETA HCG BLOOD, ED (MC, WL, AP ONLY): I-stat hCG, quantitative: >2000 m[IU]/mL — ABNORMAL HIGH (ref <5)

## 2016-12-20 LAB — LIPASE, BLOOD: Lipase: 22 U/L (ref 11–51)

## 2016-12-20 MED ORDER — SODIUM CHLORIDE 0.9 % IV BOLUS (SEPSIS)
500.0000 mL | Freq: Once | INTRAVENOUS | Status: AC
  Administered 2016-12-20: 500 mL via INTRAVENOUS

## 2016-12-20 MED ORDER — PRENATAL VITAMINS 28-0.8 MG PO TABS: 1.0000 | ORAL_TABLET | Freq: Every day | ORAL | 0 refills | Status: AC

## 2016-12-20 MED ORDER — DIPHENHYDRAMINE HCL 50 MG/ML IJ SOLN
25.0000 mg | Freq: Once | INTRAMUSCULAR | Status: AC
  Administered 2016-12-20: 25 mg via INTRAVENOUS
  Filled 2016-12-20: qty 1

## 2016-12-20 MED ORDER — METOCLOPRAMIDE HCL 10 MG PO TABS: 10.0000 mg | ORAL_TABLET | Freq: Four times a day (QID) | ORAL | 0 refills | Status: AC

## 2016-12-20 MED ORDER — ONDANSETRON 4 MG PO TBDP
4.0000 mg | ORAL_TABLET | Freq: Once | ORAL | Status: AC | PRN
  Administered 2016-12-20: 4 mg via ORAL
  Filled 2016-12-20: qty 1

## 2016-12-20 MED ORDER — SODIUM CHLORIDE 0.9 % IV BOLUS (SEPSIS)
1000.0000 mL | Freq: Once | INTRAVENOUS | Status: AC
  Administered 2016-12-20: 1000 mL via INTRAVENOUS

## 2016-12-20 MED ORDER — ONDANSETRON HCL 4 MG/2ML IJ SOLN
4.0000 mg | Freq: Once | INTRAMUSCULAR | Status: AC
  Administered 2016-12-20: 4 mg via INTRAVENOUS
  Filled 2016-12-20: qty 2

## 2016-12-20 MED ORDER — METOCLOPRAMIDE HCL 5 MG/ML IJ SOLN
10.0000 mg | Freq: Once | INTRAMUSCULAR | Status: AC
  Administered 2016-12-20: 10 mg via INTRAVENOUS
  Filled 2016-12-20: qty 2

## 2016-12-20 MED ORDER — LOPERAMIDE HCL 2 MG PO CAPS
4.0000 mg | ORAL_CAPSULE | Freq: Once | ORAL | Status: AC
  Administered 2016-12-20: 4 mg via ORAL
  Filled 2016-12-20: qty 2

## 2016-12-20 NOTE — ED Triage Notes (Signed)
PT C/O UPPER ABDOMINAL PAIN WITH N/V/D SINCE 0100. PT DENIES COUGH OR FEVER.

## 2016-12-20 NOTE — ED Provider Notes (Signed)
[facility]-EMERGENCY DEPT Provider Note   CSN: [id number] Arrival date & time: 12/20/16  1140     History   Chief Complaint Chief Complaint  Patient presents with  . Abdominal Pain  . Emesis  . Diarrhea    HPI Cynthia Hoffman is a 21 y.o. female.  She had onset about a 100 of nausea, vomiting, diarrhea. There is associated epigastric pain which occasionally radiates to the back. She rates pain at 5/10. Emesis was initially food and now is green colored. Stool has been liquid. She has had subjective fever but no chills or sweats. She has had no known sick contacts. She tried taking clear fluids at home but was unable to tolerate it.      Past Medical History:  Diagnosis Date  . ADHD   . Anxiety   . Depression     Patient Active Problem List   Diagnosis Date Noted  . Major depression, recurrent (HCC) 08/27/2016  . Alcohol dependence (HCC) 08/27/2016    Past Surgical History:  Procedure Laterality Date  . INDUCED ABORTION N/A 08/21/2016   [facility] [place] [place]    OB History    Gravida Para Term Preterm AB Living   1             SAB TAB Ectopic Multiple Live Births                   Home Medications    Prior to Admission medications   Medication Sig Start Date End Date Taking? Authorizing Provider  gabapentin (NEURONTIN) 300 MG capsule Take 1 capsule (300 mg total) by mouth 2 (two) times daily. 09/30/16 09/30/17  Benjaman Pott, MD  HYDROcodone-acetaminophen (NORCO/VICODIN) 5-325 MG tablet Take 1 tablet by mouth every 4 (four) hours as needed for moderate pain or severe pain. 10/05/16   Trixie Dredge, PA-C  ibuprofen (ADVIL,MOTRIN) 800 MG tablet Take 1 tablet (800 mg total) by mouth every 8 (eight) hours as needed for mild pain or moderate pain. 10/05/16   Trixie Dredge, PA-C  mirtazapine (REMERON) 30 MG tablet Take 1 tablet (30 mg total) by mouth at bedtime. 09/30/16 09/30/17  Benjaman Pott, MD    Family History History reviewed. No  pertinent family history.  Social History Social History  Substance Use Topics  . Smoking status: Never Smoker  . Smokeless tobacco: Never Used  . Alcohol use Yes     Allergies   Bactrim [sulfamethoxazole-trimethoprim]   Review of Systems Review of Systems  All other systems reviewed and are negative.    Physical Exam Updated Vital Signs BP 118/88   Pulse 112   Temp 98.6 F (37 C)   Resp 16   Ht 5\' 1"  (1.549 m)   Wt 160 lb (72.6 kg)   LMP 10/25/2016   SpO2 98%   BMI 30.23 kg/m   Physical Exam  Nursing note and vitals reviewed.  21 year old female, resting comfortably and in no acute distress. Vital signs are Significant for tachycardia. Oxygen saturation is 98%, which is normal. Head is normocephalic and atraumatic. PERRLA, EOMI. Oropharynx is clear. Neck is nontender and supple without adenopathy or JVD. Back is nontender and there is no CVA tenderness. Lungs are clear without rales, wheezes, or rhonchi. Chest is nontender. Heart has regular rate and rhythm without murmur. Abdomen is soft, flat, with mild to moderate epigastric tenderness. There is no rebound or guarding. There are no masses or hepatosplenomegaly and peristalsis is hypoactive.  Extremities have no cyanosis or edema, full range of motion is present. Skin is warm and dry without rash. Neurologic: Mental status is normal, cranial nerves are intact, there are no motor or sensory deficits.  ED Treatments / Results  Labs (all labs ordered are listed, but only abnormal results are displayed) Labs Reviewed  COMPREHENSIVE METABOLIC PANEL - Abnormal; Notable for the following:       Result Value   Glucose, Bld 103 (*)    Total Protein 8.7 (*)    All other components within normal limits  CBC - Abnormal; Notable for the following:    RBC 5.14 (*)    All other components within normal limits  URINALYSIS, ROUTINE W REFLEX MICROSCOPIC - Abnormal; Notable for the following:    APPearance HAZY (*)     Hgb urine dipstick SMALL (*)    Protein, ur 30 (*)    Squamous Epithelial / LPF 0-5 (*)    All other components within normal limits  DIFFERENTIAL - Abnormal; Notable for the following:    Neutro Abs 8.2 (*)    All other components within normal limits  HCG, QUANTITATIVE, PREGNANCY - Abnormal; Notable for the following:    hCG, Beta Chain, Quant, S 16,109 (*)    All other components within normal limits  I-STAT BETA HCG BLOOD, ED ([facility], [facility], [facility] ONLY) - Abnormal; Notable for the following:    I-stat hCG, quantitative >2,000.0 (*)    All other components within normal limits  LIPASE, BLOOD   Procedures Procedures (including critical care time)  Medications Ordered in ED Medications  ondansetron (ZOFRAN-ODT) disintegrating tablet 4 mg (4 mg Oral Given 12/20/16 1217)  sodium chloride 0.9 % bolus 1,000 mL (0 mLs Intravenous Stopped 12/20/16 1608)  ondansetron (ZOFRAN) injection 4 mg (4 mg Intravenous Given 12/20/16 1452)  loperamide (IMODIUM) capsule 4 mg (4 mg Oral Given 12/20/16 1450)  sodium chloride 0.9 % bolus 500 mL (500 mLs Intravenous New Bag/Given 12/20/16 1616)  metoCLOPramide (REGLAN) injection 10 mg (10 mg Intravenous Given 12/20/16 1621)  diphenhydrAMINE (BENADRYL) injection 25 mg (25 mg Intravenous Given 12/20/16 1621)     Initial Impression / Assessment and Plan / ED Course  I have reviewed the triage vital signs and the nursing notes.  Pertinent labs & imaging results that were available during my care of the patient were reviewed by me and considered in my medical decision making (see chart for details).  Nausea, vomiting, diarrhea in pattern strongly suggestive of viral gastritis. No red flags to suggest more serious illness which is not clinically dehydrated but will be given IV fluids. Screening labs obtained. She'll be given ondansetron and loperamide. Old records are reviewed, and she has no relevant past visits.  She continues to have nausea and despite of ondansetron.  She will be given additional fluids and metoclopramide. I-Stat hCG is positive-Will check quantitative hCG to confirm. Patient states last menses was the beginning of January and states she has not been using any contraception. She has noted some urinary frequency but no nausea or breast swelling or tenderness. She had a miscarriage in October and states that is her only other prior pregnancy.  She feels much better after above noted treatment. She no longer has nausea and no longer feels like she is about to have diarrhea. Quantitative hCG has come back over 36,000. She is discharged with prescription for metoclopramide and prenatal vitamins. She is [facility] to [facility] outpatient clinic for prenatal care.  Final Clinical Impressions(s) /  ED Diagnoses   Final diagnoses:  Nausea vomiting and diarrhea  First trimester pregnancy    New Prescriptions New Prescriptions   METOCLOPRAMIDE (REGLAN) 10 MG TABLET    Take 1 tablet (10 mg total) by mouth every 6 (six) hours.   PRENATAL VIT-FE FUMARATE-FA (PRENATAL VITAMINS) 28-0.8 MG TABS    Take 1 tablet by mouth daily.     Dione Boozeavid Addylynn Balin, MD 12/20/16 1740

## 2016-12-20 NOTE — ED Notes (Signed)
I attempted to collect labs and was unsuccessful patient was hollering

## 2016-12-20 NOTE — Discharge Instructions (Signed)
Take loperamide (Imodium A-D) as needed for diarrhea. ?

## 2016-12-22 ENCOUNTER — Ambulatory Visit (HOSPITAL_COMMUNITY): Payer: Self-pay | Admitting: Psychiatry

## 2017-01-05 ENCOUNTER — Encounter: Payer: Self-pay | Admitting: Obstetrics & Gynecology

## 2017-01-05 ENCOUNTER — Inpatient Hospital Stay (HOSPITAL_COMMUNITY)
Admission: AD | Admit: 2017-01-05 | Discharge: 2017-01-05 | Disposition: A | Discharge status: Home / Self Care | Payer: BLUE CROSS/BLUE SHIELD | Source: Ambulatory Visit | Attending: Obstetrics & Gynecology | Admitting: Obstetrics & Gynecology

## 2017-01-05 DIAGNOSIS — O26891 Other specified pregnancy related conditions, first trimester: Secondary | ICD-10-CM

## 2017-01-05 DIAGNOSIS — Z3A10 10 weeks gestation of pregnancy: Secondary | ICD-10-CM | POA: Diagnosis not present

## 2017-01-05 DIAGNOSIS — O99611 Diseases of the digestive system complicating pregnancy, first trimester: Secondary | ICD-10-CM | POA: Insufficient documentation

## 2017-01-05 DIAGNOSIS — Z3490 Encounter for supervision of normal pregnancy, unspecified, unspecified trimester: Secondary | ICD-10-CM

## 2017-01-05 DIAGNOSIS — R1030 Lower abdominal pain, unspecified: Secondary | ICD-10-CM | POA: Insufficient documentation

## 2017-01-05 DIAGNOSIS — K529 Noninfective gastroenteritis and colitis, unspecified: Secondary | ICD-10-CM | POA: Diagnosis not present

## 2017-01-05 DIAGNOSIS — R109 Unspecified abdominal pain: Secondary | ICD-10-CM | POA: Diagnosis not present

## 2017-01-05 LAB — URINALYSIS, ROUTINE W REFLEX MICROSCOPIC
Bilirubin Urine: NEGATIVE
GLUCOSE, UA: NEGATIVE mg/dL
HGB URINE DIPSTICK: NEGATIVE
Ketones, ur: NEGATIVE mg/dL
LEUKOCYTES UA: NEGATIVE
Nitrite: NEGATIVE
Protein, ur: NEGATIVE mg/dL
SPECIFIC GRAVITY, URINE: 1.021 (ref 1.005–1.030)
pH: 7 (ref 5.0–8.0)

## 2017-01-05 NOTE — Discharge Instructions (Signed)
Abdominal Pain During Pregnancy  Abdominal pain is common in pregnancy. Most of the time, it does not cause harm. There are many causes of abdominal pain. Some causes are more serious than others and sometimes the cause is not known. Abdominal pain can be a sign that something is very wrong with the pregnancy or the pain may have nothing to do with the pregnancy. Always tell your health care provider if you have any abdominal pain.  Follow these instructions at home:  · Do not have sex or put anything in your vagina until your symptoms go away completely.  · Watch your abdominal pain for any changes.  · Get plenty of rest until your pain improves.  · Drink enough fluid to keep your urine clear or pale yellow.  · Take over-the-counter or prescription medicines only as told by your health care provider.  · Keep all follow-up visits as told by your health care provider. This is important.  Contact a health care provider if:  · You have a fever.  · Your pain gets worse or you have cramping.  · Your pain continues after resting.  Get help right away if:  · You are bleeding, leaking fluid, or passing tissue from the vagina.  · You have vomiting or diarrhea that does not go away.  · You have painful or bloody urination.  · You notice a decrease in your baby's movements.  · You feel very weak or faint.  · You have shortness of breath.  · You develop a severe headache with abdominal pain.  · You have abnormal vaginal discharge with abdominal pain.  This information is not intended to replace advice given to you by your health care provider. Make sure you discuss any questions you have with your health care provider.  Document Released: 10/11/2005 Document Revised: 07/22/2016 Document Reviewed: 05/10/2013  Elsevier Interactive Patient Education © 2017 [employer] Inc[employer]

## 2017-01-05 NOTE — MAU Provider Note (Signed)
[facility]U Attending Note  History     CSN: [id number]  Arrival date & time 01/05/17  1630   First Provider Initiated Contact with Patient 01/05/17 1813      Chief Complaint  Patient presents with  . Abdominal Pain  . Diarrhea    Cynthia Hoffman is a 21 y.o. G2P0 at 7357w1d who presents to MAU today for evaluation of lower abdominal pain and diarrhea. Was diagnosed with viral gastroenteritis 2 weeks ago, patient reports that symptoms have improved but she wanted to make sure pregnancy was okay. Currently has sporadic loose stools, no hematochezia, and no fevers.  Denies any abnormal vaginal discharge, chills, sweats, dysuria, nausea, vomiting, other GI or GU symptoms or other general symptoms.   Obstetric History   G2   P0   T0   P0   A0   L0    SAB0   TAB0   Ectopic0   Multiple0   Live Births0     # Outcome Date GA Lbr Len/2nd Weight Sex Delivery Anes PTL Lv  2 Current           1 Gravida               Past Medical History:  Diagnosis Date  . ADHD   . Anxiety   . Depression     Past Surgical History:  Procedure Laterality Date  . INDUCED ABORTION N/A 08/21/2016   [facility] [place] [place]    History reviewed. No pertinent family history.  Social History  Substance Use Topics  . Smoking status: Never Smoker  . Smokeless tobacco: Never Used  . Alcohol use Yes    Allergies  Allergen Reactions  . Bactrim [Sulfamethoxazole-Trimethoprim] Nausea Only    Prescriptions Prior to Admission  Medication Sig Dispense Refill Last Dose  . metoCLOPramide (REGLAN) 10 MG tablet Take 1 tablet (10 mg total) by mouth every 6 (six) hours. (Patient taking differently: Take 10 mg by mouth every 6 (six) hours as needed for nausea. ) 30 tablet 0 Past Week at Unknown time  . Prenatal Vit-Fe Fumarate-FA (PRENATAL VITAMINS) 28-0.8 MG TABS Take 1 tablet by mouth daily. 30 tablet 0 01/05/2017 at Unknown time  . gabapentin (NEURONTIN) 300 MG capsule  Take 1 capsule (300 mg total) by mouth 2 (two) times daily. (Patient not taking: Reported on 01/05/2017) 60 capsule 0 Not Taking at Unknown time     Physical Exam  BP 133/60 (BP Location: Left Arm)   Pulse 97   Temp 98 F (36.7 C) (Oral)   Resp 12   Ht 5\' 1"  (1.549 m)   Wt 215 lb (97.5 kg)   LMP 10/26/2016   SpO2 100%   BMI 40.62 kg/m  GENERAL: Well-developed, well-nourished female in no acute distress  SKIN: Warm, dry and without erythema PSYCH: Normal mood and affect HEENT: Normocephalic, atraumatic.   LUNGS: Normal respiratory effort, normal breath sounds HEART: Regular rate noted ABDOMEN: Soft, nondistended, nontender PELVIC: Deferred EXTREMITIES: No edema, no cyanosis, normal range of movement  MAU Course/MDM  Bedside ultrasound showed viable IUP with FHR in 160s.   Labs and Imaging   Results for orders placed or performed during the hospital encounter of 01/05/17 (from the past 24 hour(s))  Urinalysis, Routine w reflex microscopic     Status: Abnormal   Collection Time: 01/05/17  5:17 PM  Result Value Ref Range   Color, Urine YELLOW YELLOW   APPearance HAZY (A) CLEAR  Specific Gravity, Urine 1.021 1.005 - 1.030   pH 7.0 5.0 - 8.0   Glucose, UA NEGATIVE NEGATIVE mg/dL   Hgb urine dipstick NEGATIVE NEGATIVE   Bilirubin Urine NEGATIVE NEGATIVE   Ketones, ur NEGATIVE NEGATIVE mg/dL   Protein, ur NEGATIVE NEGATIVE mg/dL   Nitrite NEGATIVE NEGATIVE   Leukocytes, UA NEGATIVE NEGATIVE   No results found.  Assessment and Plan   1. Abdominal pain during pregnancy, first trimester   2. Early stage of pregnancy    IUP at [redacted]w[redacted]d Resolving gastroenteritis, no UTI noted Patient reassured, declined electrolyte lab check as she "is doing better. Just wanted to make sure pregnancy is okay. Pregnancy verification letter given; plans to start care at [facility].  Was told to return to MAU for any pain, bleeding or other concerns, or if her condition were to change or  worsen. Discharged to home in stable condition    Allergies as of 01/05/2017      Reactions   Bactrim [sulfamethoxazole-trimethoprim] Nausea Only      Medication List    STOP taking these medications   gabapentin 300 MG capsule Commonly known as:  NEURONTIN     TAKE these medications   metoCLOPramide 10 MG tablet Commonly known as:  REGLAN Take 1 tablet (10 mg total) by mouth every 6 (six) hours. What changed:  when to take this  reasons to take this   Prenatal Vitamins 28-0.8 MG Tabs Take 1 tablet by mouth daily.        Jaynie Collins, MD, FACOG Attending Obstetrician & Gynecologist, [facility] for [employer], [facility]

## 2017-01-05 NOTE — MAU Note (Signed)
Pt had pos UPT 2 weeks ago, has a lot of pain & discomfort in lower abd, also some in upper abdomen.  Denies bleeding.  Had 3-4 diarrhea stools this morning, taking anti nausea med.

## 2017-02-08 ENCOUNTER — Ambulatory Visit (HOSPITAL_COMMUNITY): Payer: Self-pay | Admitting: Psychiatry

## 2017-03-30 IMAGING — US US OB COMP LESS 14 WK
1 series · 15 of 28 positions shown · non-contrast
Comparison: None.

CLINICAL DATA: Acute onset of pelvic cramping.  Initial encounter.

EXAM:
OBSTETRIC <14 WK US AND TRANSVAGINAL OB US
TECHNIQUE: Both transabdominal and transvaginal ultrasound examinations were
performed for complete evaluation of the gestation as well as the
maternal uterus, adnexal regions, and pelvic cul-de-sac.
Transvaginal technique was performed to assess early pregnancy.

[Series 1: us ob comp less 14 wk · 15 of 28 slices shown]
[im 1/28]
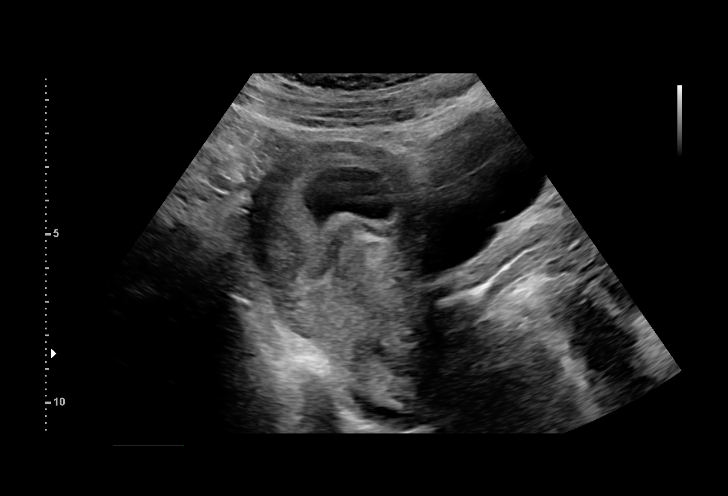
[im 3/28]
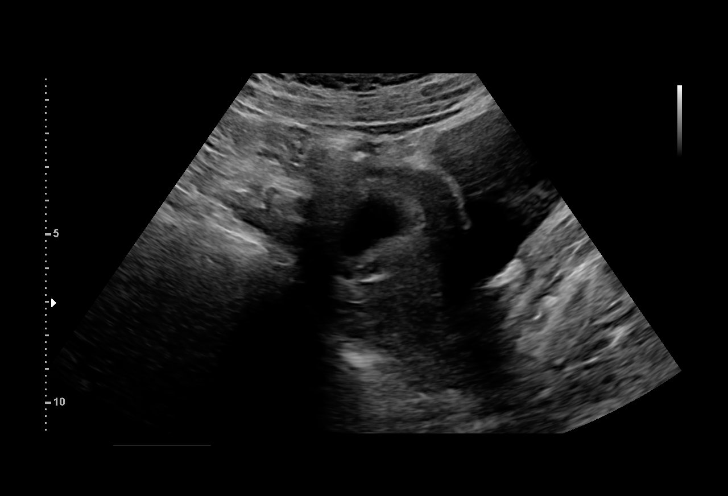
[im 5/28]
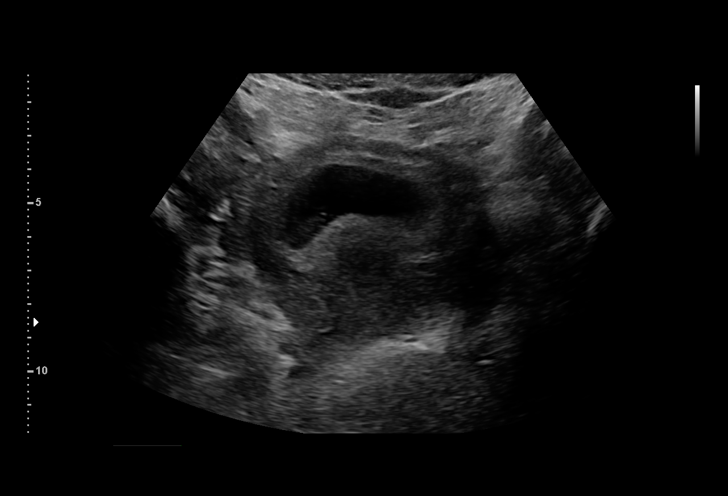
[im 7/28]
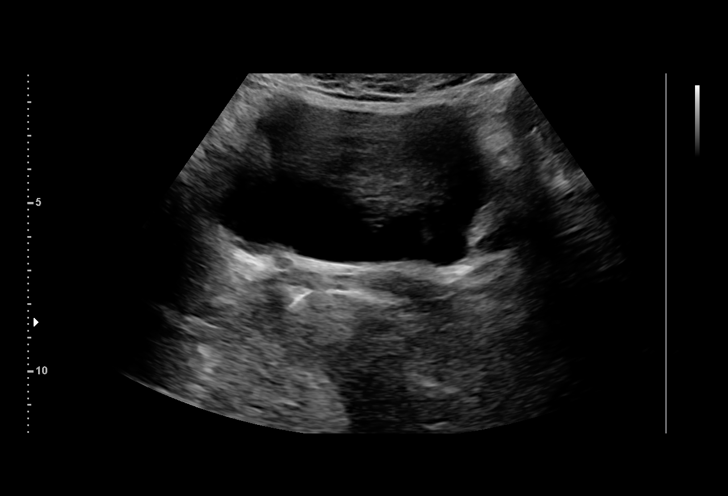
[im 9/28]
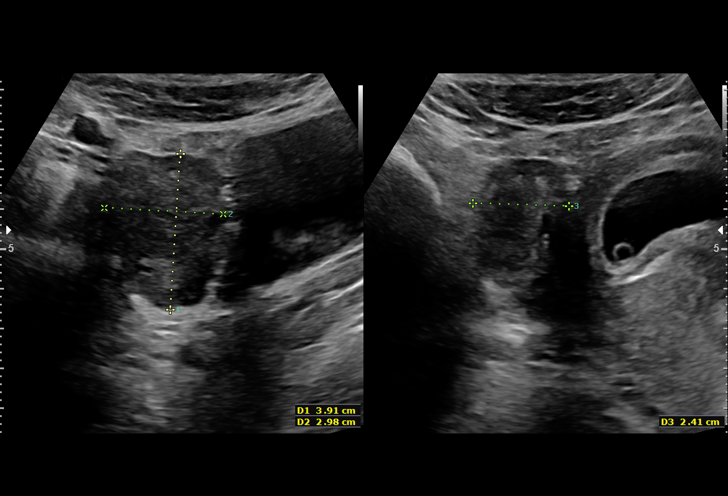
[im 11/28]
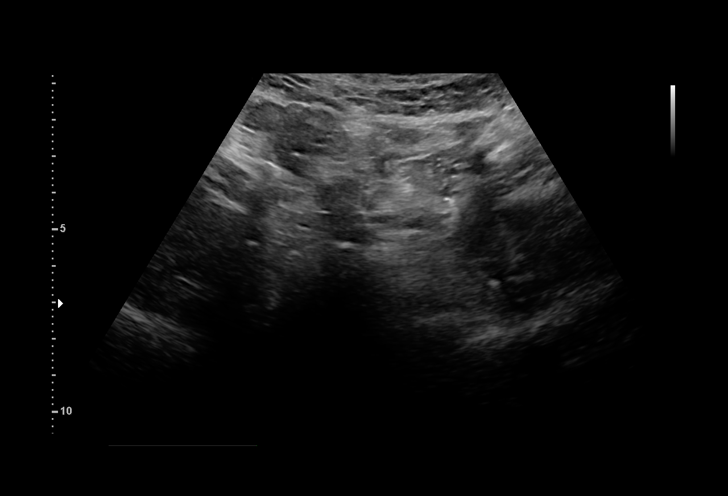
[im 13/28]
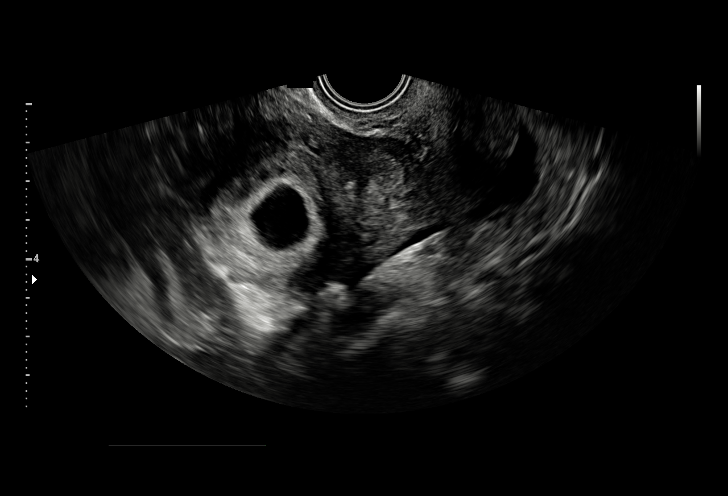
[im 15/28]
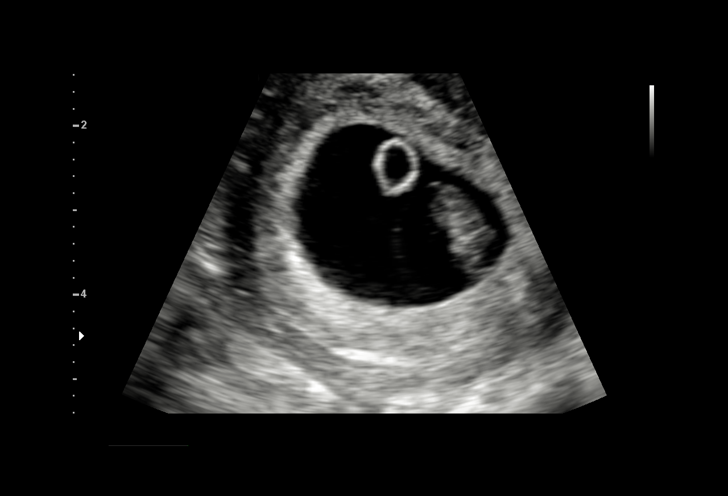
[im 16/28]
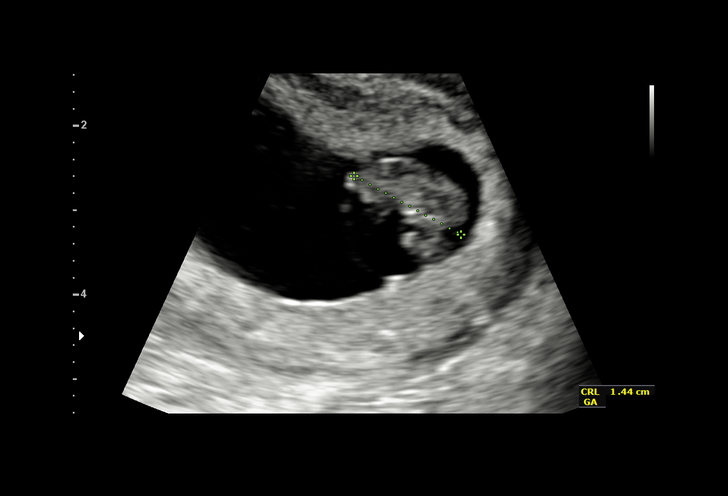
[im 18/28]
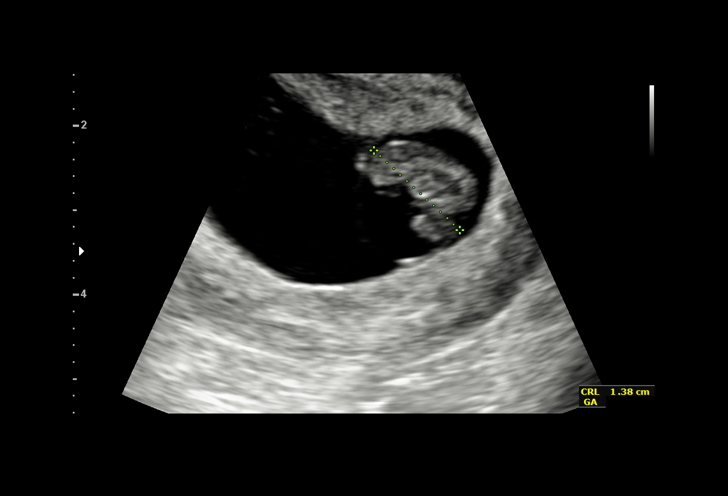
[im 20/28]
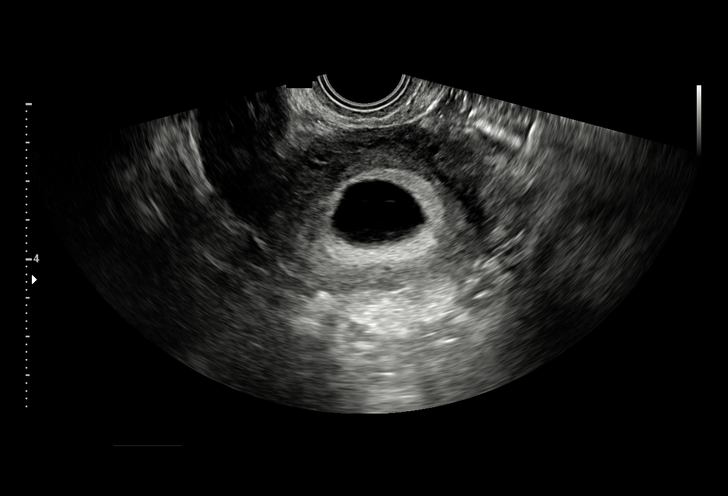
[im 22/28]
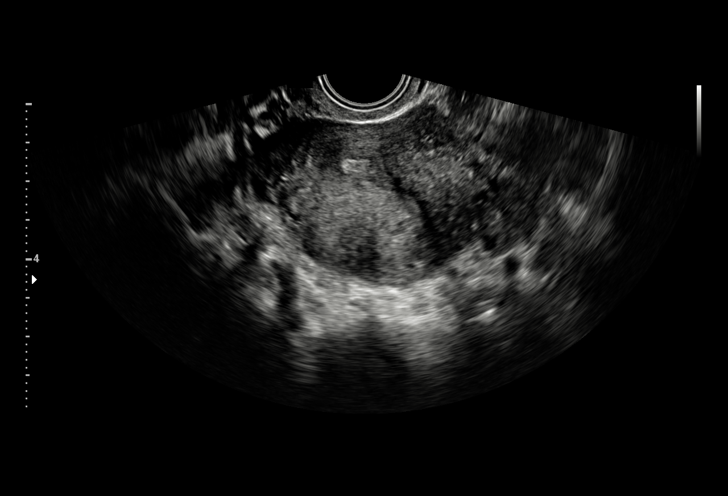
[im 24/28]
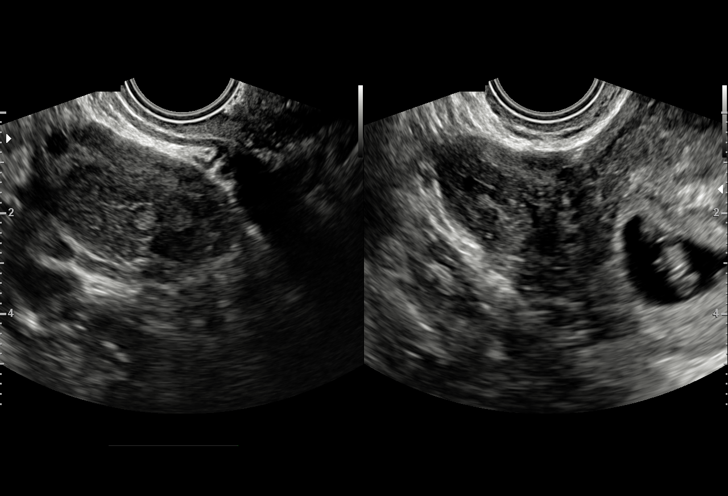
[im 26/28]
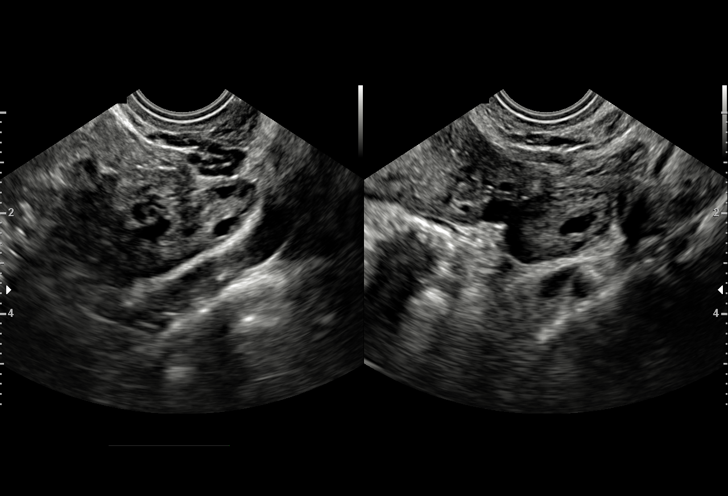
[im 28/28]
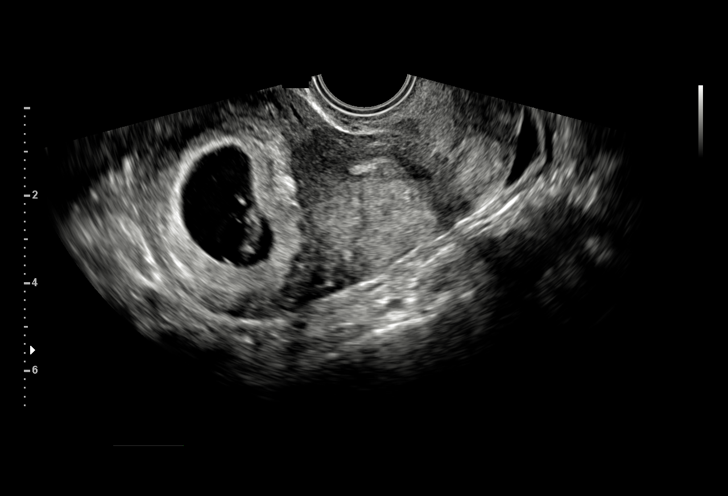

[15 of 28 positions shown; findings below may reference images not displayed]

FINDINGS: Intrauterine gestational sac: Single; visualized and normal in
shape.

Yolk sac:  Yes

Embryo:  Yes

Cardiac Activity: Yes

Heart Rate: 155  bpm

CRL:  13.7  mm   7 w   4 d                  US EDC: 03/08/2017

Subchorionic hemorrhage:  None visualized.

Maternal uterus/adnexae: The uterus is otherwise unremarkable.

The ovaries are within normal limits. The right ovary measures 4.2 x
2.4 x 2.7 cm, while the left ovary measures 3.5 x 1.0 x 2.1 cm. No
suspicious adnexal masses are seen; there is no evidence for ovarian
torsion.

Trace free fluid is seen within the pelvic cul-de-sac.
IMPRESSION: Single live intrauterine pregnancy noted, with a crown-rump length
of 1.4 cm, corresponding to a gestational age of 7 weeks 4 days.
This reflects an estimated date of delivery March 08, 2017.

## 2017-10-25 ENCOUNTER — Encounter (HOSPITAL_COMMUNITY): Payer: Self-pay
